# Patient Record
Sex: Male | Born: 1935 | Race: White | Hispanic: No | Marital: Married | Smoking: Former smoker
Health system: Southern US, Community
[De-identification: ages and names within clinical notes are randomized; demographics above are authoritative.]

## PROBLEM LIST (undated history)

## (undated) DIAGNOSIS — H9190 Unspecified hearing loss, unspecified ear: Secondary | ICD-10-CM

## (undated) DIAGNOSIS — C61 Malignant neoplasm of prostate: Secondary | ICD-10-CM

## (undated) HISTORY — DX: Unspecified hearing loss, unspecified ear: H91.90

## (undated) HISTORY — DX: Malignant neoplasm of prostate: C61

## (undated) HISTORY — PX: PACEMAKER PLACEMENT: SHX43

---

## 2003-10-18 ENCOUNTER — Other Ambulatory Visit: Payer: Self-pay

## 2004-07-04 ENCOUNTER — Other Ambulatory Visit: Payer: Self-pay

## 2004-07-04 ENCOUNTER — Ambulatory Visit: Payer: Self-pay | Admitting: Specialist

## 2004-07-09 ENCOUNTER — Ambulatory Visit: Payer: Self-pay | Admitting: Specialist

## 2005-04-02 ENCOUNTER — Ambulatory Visit: Payer: Self-pay | Admitting: General Surgery

## 2005-12-18 ENCOUNTER — Ambulatory Visit: Payer: Self-pay

## 2005-12-21 ENCOUNTER — Ambulatory Visit: Payer: Self-pay | Admitting: General Surgery

## 2005-12-21 ENCOUNTER — Inpatient Hospital Stay: Payer: Self-pay | Admitting: General Surgery

## 2005-12-30 ENCOUNTER — Ambulatory Visit: Payer: Self-pay | Admitting: General Surgery

## 2006-01-06 ENCOUNTER — Ambulatory Visit: Payer: Self-pay | Admitting: General Surgery

## 2006-01-12 ENCOUNTER — Ambulatory Visit: Payer: Self-pay | Admitting: General Surgery

## 2006-01-14 ENCOUNTER — Other Ambulatory Visit: Payer: Self-pay

## 2006-01-14 ENCOUNTER — Ambulatory Visit: Payer: Self-pay | Admitting: General Surgery

## 2007-04-27 ENCOUNTER — Ambulatory Visit: Payer: Self-pay | Admitting: Family Medicine

## 2007-04-29 ENCOUNTER — Ambulatory Visit: Payer: Self-pay | Admitting: Specialist

## 2007-04-29 ENCOUNTER — Other Ambulatory Visit: Payer: Self-pay

## 2007-05-05 ENCOUNTER — Ambulatory Visit: Payer: Self-pay | Admitting: Specialist

## 2007-07-04 ENCOUNTER — Ambulatory Visit: Payer: Self-pay | Admitting: Oncology

## 2007-07-25 ENCOUNTER — Ambulatory Visit: Payer: Self-pay | Admitting: Oncology

## 2007-07-29 ENCOUNTER — Ambulatory Visit: Payer: Self-pay | Admitting: Oncology

## 2007-08-04 ENCOUNTER — Ambulatory Visit: Payer: Self-pay | Admitting: Oncology

## 2007-09-04 ENCOUNTER — Ambulatory Visit: Payer: Self-pay | Admitting: Oncology

## 2007-09-14 ENCOUNTER — Ambulatory Visit: Payer: Self-pay | Admitting: Oncology

## 2007-09-23 ENCOUNTER — Ambulatory Visit: Payer: Self-pay | Admitting: Oncology

## 2007-10-02 ENCOUNTER — Ambulatory Visit: Payer: Self-pay | Admitting: Oncology

## 2007-12-02 ENCOUNTER — Ambulatory Visit: Payer: Self-pay | Admitting: Oncology

## 2007-12-07 ENCOUNTER — Ambulatory Visit: Payer: Self-pay | Admitting: Oncology

## 2007-12-19 ENCOUNTER — Ambulatory Visit: Payer: Self-pay | Admitting: Gastroenterology

## 2008-01-02 ENCOUNTER — Ambulatory Visit: Payer: Self-pay | Admitting: Oncology

## 2008-03-07 ENCOUNTER — Ambulatory Visit: Payer: Self-pay | Admitting: Oncology

## 2008-03-12 ENCOUNTER — Ambulatory Visit: Payer: Self-pay | Admitting: Oncology

## 2008-03-14 ENCOUNTER — Ambulatory Visit: Payer: Self-pay | Admitting: Oncology

## 2008-03-23 ENCOUNTER — Ambulatory Visit: Payer: Self-pay | Admitting: Oncology

## 2008-04-03 ENCOUNTER — Ambulatory Visit: Payer: Self-pay | Admitting: Oncology

## 2008-05-03 ENCOUNTER — Ambulatory Visit: Payer: Self-pay | Admitting: Oncology

## 2008-06-03 ENCOUNTER — Ambulatory Visit: Payer: Self-pay | Admitting: Oncology

## 2008-07-03 ENCOUNTER — Ambulatory Visit: Payer: Self-pay | Admitting: Oncology

## 2008-08-03 ENCOUNTER — Ambulatory Visit: Payer: Self-pay | Admitting: Oncology

## 2008-09-03 ENCOUNTER — Ambulatory Visit: Payer: Self-pay | Admitting: Oncology

## 2008-10-01 ENCOUNTER — Ambulatory Visit: Payer: Self-pay | Admitting: Oncology

## 2008-11-01 ENCOUNTER — Ambulatory Visit: Payer: Self-pay | Admitting: Oncology

## 2008-12-01 ENCOUNTER — Ambulatory Visit: Payer: Self-pay | Admitting: Oncology

## 2009-01-01 ENCOUNTER — Ambulatory Visit: Payer: Self-pay | Admitting: Oncology

## 2009-01-31 ENCOUNTER — Ambulatory Visit: Payer: Self-pay | Admitting: Oncology

## 2009-03-03 ENCOUNTER — Ambulatory Visit: Payer: Self-pay | Admitting: Oncology

## 2009-04-03 ENCOUNTER — Ambulatory Visit: Payer: Self-pay | Admitting: Oncology

## 2009-05-02 ENCOUNTER — Ambulatory Visit: Payer: Self-pay | Admitting: Gastroenterology

## 2009-05-03 ENCOUNTER — Ambulatory Visit: Payer: Self-pay | Admitting: Oncology

## 2009-06-03 ENCOUNTER — Ambulatory Visit: Payer: Self-pay | Admitting: Oncology

## 2009-07-03 ENCOUNTER — Ambulatory Visit: Payer: Self-pay | Admitting: Oncology

## 2009-08-03 ENCOUNTER — Ambulatory Visit: Payer: Self-pay | Admitting: Oncology

## 2009-09-03 ENCOUNTER — Ambulatory Visit: Payer: Self-pay | Admitting: Oncology

## 2009-10-01 ENCOUNTER — Ambulatory Visit: Payer: Self-pay | Admitting: Oncology

## 2009-12-01 ENCOUNTER — Ambulatory Visit: Payer: Self-pay | Admitting: Oncology

## 2009-12-20 ENCOUNTER — Ambulatory Visit: Payer: Self-pay | Admitting: Oncology

## 2010-01-01 ENCOUNTER — Ambulatory Visit: Payer: Self-pay | Admitting: Oncology

## 2010-03-03 ENCOUNTER — Ambulatory Visit: Payer: Self-pay | Admitting: Oncology

## 2010-03-24 ENCOUNTER — Ambulatory Visit: Payer: Self-pay | Admitting: Oncology

## 2010-04-03 ENCOUNTER — Ambulatory Visit: Payer: Self-pay | Admitting: Oncology

## 2010-04-10 ENCOUNTER — Ambulatory Visit: Payer: Self-pay | Admitting: Cardiology

## 2010-04-15 ENCOUNTER — Ambulatory Visit: Payer: Self-pay | Admitting: Cardiology

## 2010-05-03 ENCOUNTER — Ambulatory Visit: Payer: Self-pay | Admitting: Oncology

## 2010-05-06 ENCOUNTER — Ambulatory Visit: Payer: Self-pay | Admitting: Oncology

## 2010-06-03 ENCOUNTER — Ambulatory Visit: Payer: Self-pay | Admitting: Oncology

## 2010-07-03 ENCOUNTER — Ambulatory Visit: Payer: Self-pay | Admitting: Oncology

## 2010-08-03 ENCOUNTER — Ambulatory Visit: Payer: Self-pay | Admitting: Oncology

## 2010-09-03 ENCOUNTER — Ambulatory Visit: Payer: Self-pay | Admitting: Oncology

## 2010-10-14 ENCOUNTER — Ambulatory Visit: Payer: Self-pay | Admitting: Oncology

## 2010-10-21 LAB — CEA: CEA: 2.4 ng/mL (ref 0.0–4.7)

## 2010-11-02 ENCOUNTER — Ambulatory Visit: Payer: Self-pay | Admitting: Oncology

## 2010-12-02 ENCOUNTER — Ambulatory Visit: Payer: Self-pay | Admitting: Oncology

## 2010-12-15 ENCOUNTER — Ambulatory Visit: Payer: Self-pay | Admitting: Gastroenterology

## 2011-01-02 ENCOUNTER — Ambulatory Visit: Payer: Self-pay | Admitting: Oncology

## 2011-01-19 ENCOUNTER — Ambulatory Visit: Payer: Self-pay | Admitting: Gastroenterology

## 2011-02-05 ENCOUNTER — Ambulatory Visit: Payer: Self-pay | Admitting: Oncology

## 2011-03-04 ENCOUNTER — Ambulatory Visit: Payer: Self-pay | Admitting: Oncology

## 2011-03-24 ENCOUNTER — Ambulatory Visit: Payer: Self-pay | Admitting: Gastroenterology

## 2011-04-04 ENCOUNTER — Ambulatory Visit: Payer: Self-pay | Admitting: Oncology

## 2011-04-24 LAB — PSA: PSA: <0.1 ng/mL (ref 0.0–4.0)

## 2011-05-04 ENCOUNTER — Ambulatory Visit: Payer: Self-pay | Admitting: Oncology

## 2011-06-09 ENCOUNTER — Ambulatory Visit: Payer: Self-pay | Admitting: Oncology

## 2011-07-04 ENCOUNTER — Ambulatory Visit: Payer: Self-pay | Admitting: Oncology

## 2011-08-04 ENCOUNTER — Ambulatory Visit: Payer: Self-pay | Admitting: Oncology

## 2011-08-05 LAB — CBC CANCER CENTER
Basophil %: 0.3 %
Eosinophil #: 0 x10 3/mm (ref 0.0–0.7)
HGB: 13.7 g/dL (ref 13.0–18.0)
Lymphocyte %: 15.8 %
MCH: 30.2 pg (ref 26.0–34.0)
MCHC: 34 g/dL (ref 32.0–36.0)
MCV: 89 fL (ref 80–100)
Monocyte %: 7 %
Neutrophil #: 5.3 x10 3/mm (ref 1.4–6.5)
RDW: 14.9 % — ABNORMAL HIGH (ref 11.5–14.5)

## 2011-08-05 LAB — COMPREHENSIVE METABOLIC PANEL
Albumin: 3.5 g/dL (ref 3.4–5.0)
BUN: 34 mg/dL — ABNORMAL HIGH (ref 7–18)
Bilirubin,Total: 0.7 mg/dL (ref 0.2–1.0)
Chloride: 102 mmol/L (ref 98–107)
Creatinine: 1.55 mg/dL — ABNORMAL HIGH (ref 0.60–1.30)
EGFR (African American): 55 — ABNORMAL LOW
Potassium: 3.2 mmol/L — ABNORMAL LOW (ref 3.5–5.1)
SGPT (ALT): 24 U/L
Total Protein: 6.7 g/dL (ref 6.4–8.2)

## 2011-09-04 ENCOUNTER — Ambulatory Visit: Payer: Self-pay | Admitting: Oncology

## 2011-10-05 ENCOUNTER — Ambulatory Visit: Payer: Self-pay | Admitting: Oncology

## 2011-10-05 LAB — CBC CANCER CENTER
Basophil #: 0 x10 3/mm (ref 0.0–0.1)
Basophil %: 0.4 %
Eosinophil #: 0.1 x10 3/mm (ref 0.0–0.7)
HCT: 41.2 % (ref 40.0–52.0)
HGB: 14 g/dL (ref 13.0–18.0)
Lymphocyte %: 25.4 %
MCH: 30.9 pg (ref 26.0–34.0)
MCHC: 33.9 g/dL (ref 32.0–36.0)
Monocyte #: 0.4 x10 3/mm (ref 0.0–0.7)
Neutrophil #: 4.1 x10 3/mm (ref 1.4–6.5)
Neutrophil %: 66.6 %
Platelet: 171 x10 3/mm (ref 150–440)

## 2011-10-05 LAB — COMPREHENSIVE METABOLIC PANEL
Albumin: 3.6 g/dL (ref 3.4–5.0)
Calcium, Total: 9.4 mg/dL (ref 8.5–10.1)
Co2: 30 mmol/L (ref 21–32)
EGFR (African American): 53 — ABNORMAL LOW
Glucose: 109 mg/dL — ABNORMAL HIGH (ref 65–99)
Potassium: 4.2 mmol/L (ref 3.5–5.1)
SGOT(AST): 26 U/L (ref 15–37)
Total Protein: 6.7 g/dL (ref 6.4–8.2)

## 2011-10-06 LAB — PSA: PSA: <0.1 ng/mL (ref 0.0–4.0)

## 2011-11-02 ENCOUNTER — Ambulatory Visit: Payer: Self-pay | Admitting: Oncology

## 2011-12-24 ENCOUNTER — Ambulatory Visit: Payer: Self-pay | Admitting: Oncology

## 2011-12-24 LAB — COMPREHENSIVE METABOLIC PANEL
Alkaline Phosphatase: 60 U/L (ref 50–136)
BUN: 36 mg/dL — ABNORMAL HIGH (ref 7–18)
Bilirubin,Total: 0.5 mg/dL (ref 0.2–1.0)
Calcium, Total: 8.5 mg/dL (ref 8.5–10.1)
Creatinine: 1.85 mg/dL — ABNORMAL HIGH (ref 0.60–1.30)
EGFR (African American): 38 — ABNORMAL LOW
EGFR (Non-African Amer.): 32 — ABNORMAL LOW
Potassium: 3.4 mmol/L — ABNORMAL LOW (ref 3.5–5.1)
SGPT (ALT): 31 U/L
Sodium: 142 mmol/L (ref 136–145)

## 2011-12-24 LAB — CBC CANCER CENTER
Basophil %: 0.8 %
HGB: 13.1 g/dL (ref 13.0–18.0)
Lymphocyte #: 1 x10 3/mm (ref 1.0–3.6)
MCH: 29.3 pg (ref 26.0–34.0)
MCHC: 32.8 g/dL (ref 32.0–36.0)
Monocyte #: 0.5 x10 3/mm (ref 0.2–1.0)
Monocyte %: 6.5 %
Platelet: 179 x10 3/mm (ref 150–440)
RBC: 4.46 10*6/uL (ref 4.40–5.90)
RDW: 14 % (ref 11.5–14.5)

## 2012-01-02 ENCOUNTER — Ambulatory Visit: Payer: Self-pay | Admitting: Oncology

## 2012-02-08 ENCOUNTER — Ambulatory Visit: Payer: Self-pay | Admitting: Oncology

## 2012-02-08 LAB — CBC CANCER CENTER
Eosinophil #: 0.1 x10 3/mm (ref 0.0–0.7)
Eosinophil %: 1.1 %
HCT: 40.9 % (ref 40.0–52.0)
Lymphocyte #: 1.3 x10 3/mm (ref 1.0–3.6)
MCH: 29.9 pg (ref 26.0–34.0)
MCHC: 33.7 g/dL (ref 32.0–36.0)
MCV: 89 fL (ref 80–100)
Monocyte #: 0.5 x10 3/mm (ref 0.2–1.0)
Monocyte %: 7.1 %
Neutrophil #: 5.7 x10 3/mm (ref 1.4–6.5)
Platelet: 169 x10 3/mm (ref 150–440)
RDW: 14.5 % (ref 11.5–14.5)
WBC: 7.6 x10 3/mm (ref 3.8–10.6)

## 2012-02-08 LAB — COMPREHENSIVE METABOLIC PANEL
Albumin: 3.5 g/dL (ref 3.4–5.0)
Alkaline Phosphatase: 75 U/L (ref 50–136)
BUN: 41 mg/dL — ABNORMAL HIGH (ref 7–18)
Bilirubin,Total: 0.8 mg/dL (ref 0.2–1.0)
Calcium, Total: 9.4 mg/dL (ref 8.5–10.1)
Creatinine: 2 mg/dL — ABNORMAL HIGH (ref 0.60–1.30)
Glucose: 89 mg/dL (ref 65–99)
Osmolality: 293 (ref 275–301)
Sodium: 142 mmol/L (ref 136–145)

## 2012-02-22 LAB — BASIC METABOLIC PANEL
Anion Gap: 9 (ref 7–16)
BUN: 33 mg/dL — ABNORMAL HIGH (ref 7–18)
Chloride: 105 mmol/L (ref 98–107)
Co2: 31 mmol/L (ref 21–32)
EGFR (Non-African Amer.): 36 — ABNORMAL LOW
Osmolality: 295 (ref 275–301)

## 2012-03-03 ENCOUNTER — Ambulatory Visit: Payer: Self-pay | Admitting: Oncology

## 2012-04-19 ENCOUNTER — Ambulatory Visit: Payer: Self-pay | Admitting: Oncology

## 2012-04-19 LAB — COMPREHENSIVE METABOLIC PANEL
Albumin: 3.4 g/dL (ref 3.4–5.0)
Anion Gap: 5 — ABNORMAL LOW (ref 7–16)
BUN: 25 mg/dL — ABNORMAL HIGH (ref 7–18)
Bilirubin,Total: 0.8 mg/dL (ref 0.2–1.0)
Calcium, Total: 9 mg/dL (ref 8.5–10.1)
Creatinine: 1.79 mg/dL — ABNORMAL HIGH (ref 0.60–1.30)
Glucose: 185 mg/dL — ABNORMAL HIGH (ref 65–99)
Osmolality: 294 (ref 275–301)
Potassium: 2.9 mmol/L — ABNORMAL LOW (ref 3.5–5.1)
SGPT (ALT): 25 U/L (ref 12–78)
Sodium: 143 mmol/L (ref 136–145)
Total Protein: 6.6 g/dL (ref 6.4–8.2)

## 2012-04-19 LAB — CBC CANCER CENTER
Basophil #: 0.1 x10 3/mm (ref 0.0–0.1)
Eosinophil %: 0.6 %
HCT: 41.2 % (ref 40.0–52.0)
Lymphocyte #: 1 x10 3/mm (ref 1.0–3.6)
Lymphocyte %: 12.3 %
MCH: 28.9 pg (ref 26.0–34.0)
MCV: 89 fL (ref 80–100)
Monocyte %: 7.9 %
Neutrophil #: 6.3 x10 3/mm (ref 1.4–6.5)
Neutrophil %: 78.5 %
Platelet: 166 x10 3/mm (ref 150–440)
RBC: 4.62 10*6/uL (ref 4.40–5.90)
RDW: 14 % (ref 11.5–14.5)
WBC: 8 x10 3/mm (ref 3.8–10.6)

## 2012-04-23 ENCOUNTER — Emergency Department: Payer: Self-pay | Admitting: Emergency Medicine

## 2012-05-03 ENCOUNTER — Ambulatory Visit: Payer: Self-pay | Admitting: Oncology

## 2012-06-13 ENCOUNTER — Ambulatory Visit: Payer: Self-pay | Admitting: Oncology

## 2012-06-21 ENCOUNTER — Ambulatory Visit: Payer: Self-pay | Admitting: Oncology

## 2012-06-21 LAB — CBC CANCER CENTER
Basophil #: 0.1 x10 3/mm (ref 0.0–0.1)
HCT: 43.8 % (ref 40.0–52.0)
Lymphocyte #: 1.2 x10 3/mm (ref 1.0–3.6)
Lymphocyte %: 15.6 %
MCH: 28.5 pg (ref 26.0–34.0)
MCV: 89 fL (ref 80–100)
Monocyte %: 6.9 %
Neutrophil %: 75.6 %
Platelet: 156 x10 3/mm (ref 150–440)
RBC: 4.92 10*6/uL (ref 4.40–5.90)
RDW: 15.1 % — ABNORMAL HIGH (ref 11.5–14.5)
WBC: 7.7 x10 3/mm (ref 3.8–10.6)

## 2012-06-21 LAB — COMPREHENSIVE METABOLIC PANEL
Albumin: 3.4 g/dL (ref 3.4–5.0)
Alkaline Phosphatase: 56 U/L (ref 50–136)
Anion Gap: 8 (ref 7–16)
Co2: 32 mmol/L (ref 21–32)
Creatinine: 1.7 mg/dL — ABNORMAL HIGH (ref 0.60–1.30)
EGFR (African American): 41 — ABNORMAL LOW
Glucose: 112 mg/dL — ABNORMAL HIGH (ref 65–99)
Osmolality: 295 (ref 275–301)
Sodium: 144 mmol/L (ref 136–145)
Total Protein: 6.4 g/dL (ref 6.4–8.2)

## 2012-07-03 ENCOUNTER — Ambulatory Visit: Payer: Self-pay | Admitting: Oncology

## 2012-07-18 LAB — CBC CANCER CENTER
Basophil #: 0.1 x10 3/mm (ref 0.0–0.1)
Basophil %: 0.8 %
Eosinophil %: 1.4 %
HGB: 14.5 g/dL (ref 13.0–18.0)
Lymphocyte %: 21.3 %
MCH: 29.6 pg (ref 26.0–34.0)
Monocyte #: 0.5 x10 3/mm (ref 0.2–1.0)
Neutrophil %: 68.5 %
WBC: 6.8 x10 3/mm (ref 3.8–10.6)

## 2012-07-18 LAB — COMPREHENSIVE METABOLIC PANEL
Albumin: 3.4 g/dL (ref 3.4–5.0)
Anion Gap: 9 (ref 7–16)
Bilirubin,Total: 0.9 mg/dL (ref 0.2–1.0)
Calcium, Total: 8.9 mg/dL (ref 8.5–10.1)
Chloride: 105 mmol/L (ref 98–107)
Co2: 31 mmol/L (ref 21–32)
Creatinine: 1.69 mg/dL — ABNORMAL HIGH (ref 0.60–1.30)
EGFR (African American): 42 — ABNORMAL LOW
Potassium: 3.4 mmol/L — ABNORMAL LOW (ref 3.5–5.1)
SGPT (ALT): 30 U/L (ref 12–78)
Sodium: 145 mmol/L (ref 136–145)
Total Protein: 6.5 g/dL (ref 6.4–8.2)

## 2012-08-03 ENCOUNTER — Ambulatory Visit: Payer: Self-pay | Admitting: Oncology

## 2012-08-03 DIAGNOSIS — C61 Malignant neoplasm of prostate: Secondary | ICD-10-CM

## 2012-08-03 HISTORY — DX: Malignant neoplasm of prostate: C61

## 2012-10-01 ENCOUNTER — Ambulatory Visit: Payer: Self-pay | Admitting: Oncology

## 2012-10-10 LAB — CBC CANCER CENTER
Basophil %: 0.6 %
Eosinophil %: 1.3 %
HCT: 45.7 % (ref 40.0–52.0)
Lymphocyte #: 1.1 x10 3/mm (ref 1.0–3.6)
Lymphocyte %: 12.4 %
MCH: 29.3 pg (ref 26.0–34.0)
MCV: 88 fL (ref 80–100)
Monocyte #: 0.5 x10 3/mm (ref 0.2–1.0)
Neutrophil %: 80.4 %
RDW: 14.6 % — ABNORMAL HIGH (ref 11.5–14.5)
WBC: 8.7 x10 3/mm (ref 3.8–10.6)

## 2012-10-10 LAB — COMPREHENSIVE METABOLIC PANEL
Albumin: 3.5 g/dL (ref 3.4–5.0)
Alkaline Phosphatase: 70 U/L (ref 50–136)
Anion Gap: 8 (ref 7–16)
BUN: 42 mg/dL — ABNORMAL HIGH (ref 7–18)
Calcium, Total: 9.1 mg/dL (ref 8.5–10.1)
Co2: 33 mmol/L — ABNORMAL HIGH (ref 21–32)
Creatinine: 2.1 mg/dL — ABNORMAL HIGH (ref 0.60–1.30)
EGFR (Non-African Amer.): 28 — ABNORMAL LOW
Glucose: 146 mg/dL — ABNORMAL HIGH (ref 65–99)
Osmolality: 296 (ref 275–301)
SGOT(AST): 22 U/L (ref 15–37)
SGPT (ALT): 31 U/L (ref 12–78)
Total Protein: 6.7 g/dL (ref 6.4–8.2)

## 2012-11-01 ENCOUNTER — Ambulatory Visit: Payer: Self-pay | Admitting: Oncology

## 2012-11-21 LAB — COMPREHENSIVE METABOLIC PANEL
Albumin: 3.4 g/dL (ref 3.4–5.0)
Alkaline Phosphatase: 69 U/L (ref 50–136)
Anion Gap: 12 (ref 7–16)
Bilirubin,Total: 0.8 mg/dL (ref 0.2–1.0)
Chloride: 104 mmol/L (ref 98–107)
Creatinine: 2 mg/dL — ABNORMAL HIGH (ref 0.60–1.30)
EGFR (African American): 34 — ABNORMAL LOW
Glucose: 110 mg/dL — ABNORMAL HIGH (ref 65–99)
Osmolality: 297 (ref 275–301)
Potassium: 3.7 mmol/L (ref 3.5–5.1)
SGPT (ALT): 31 U/L (ref 12–78)
Sodium: 144 mmol/L (ref 136–145)
Total Protein: 6.7 g/dL (ref 6.4–8.2)

## 2012-11-21 LAB — CBC CANCER CENTER
Basophil %: 0.5 %
Eosinophil %: 0.4 %
HCT: 43.8 % (ref 40.0–52.0)
HGB: 14.8 g/dL (ref 13.0–18.0)
Lymphocyte #: 1 x10 3/mm (ref 1.0–3.6)
Lymphocyte %: 10 %
MCH: 29.3 pg (ref 26.0–34.0)
Monocyte #: 0.6 x10 3/mm (ref 0.2–1.0)
Monocyte %: 6 %
Platelet: 213 x10 3/mm (ref 150–440)
RDW: 14.5 % (ref 11.5–14.5)

## 2012-12-01 ENCOUNTER — Ambulatory Visit: Payer: Self-pay | Admitting: Oncology

## 2012-12-30 ENCOUNTER — Ambulatory Visit: Payer: Self-pay | Admitting: Neurology

## 2013-01-02 ENCOUNTER — Ambulatory Visit: Payer: Self-pay | Admitting: Oncology

## 2013-01-02 LAB — CBC CANCER CENTER
Basophil %: 0.9 %
Eosinophil #: 0.1 x10 3/mm (ref 0.0–0.7)
Eosinophil %: 1 %
HCT: 39.5 % — ABNORMAL LOW (ref 40.0–52.0)
HGB: 13.3 g/dL (ref 13.0–18.0)
MCH: 29.5 pg (ref 26.0–34.0)
MCHC: 33.8 g/dL (ref 32.0–36.0)
MCV: 87 fL (ref 80–100)
Monocyte %: 6.5 %
Neutrophil #: 4.8 x10 3/mm (ref 1.4–6.5)
Neutrophil %: 77.2 %
Platelet: 171 x10 3/mm (ref 150–440)
RBC: 4.53 10*6/uL (ref 4.40–5.90)
WBC: 6.2 x10 3/mm (ref 3.8–10.6)

## 2013-01-02 LAB — COMPREHENSIVE METABOLIC PANEL
Albumin: 3 g/dL — ABNORMAL LOW (ref 3.4–5.0)
Alkaline Phosphatase: 92 U/L (ref 50–136)
Anion Gap: 8 (ref 7–16)
BUN: 27 mg/dL — ABNORMAL HIGH (ref 7–18)
Co2: 32 mmol/L (ref 21–32)
EGFR (African American): 36 — ABNORMAL LOW
EGFR (Non-African Amer.): 31 — ABNORMAL LOW
Glucose: 108 mg/dL — ABNORMAL HIGH (ref 65–99)
Osmolality: 289 (ref 275–301)
Total Protein: 6.2 g/dL — ABNORMAL LOW (ref 6.4–8.2)

## 2013-01-02 LAB — HEPATIC FUNCTION PANEL A (ARMC): Bilirubin, Direct: 0.3 mg/dL — ABNORMAL HIGH (ref 0.00–0.20)

## 2013-01-09 ENCOUNTER — Inpatient Hospital Stay: Payer: Self-pay | Admitting: Specialist

## 2013-01-09 LAB — COMPREHENSIVE METABOLIC PANEL
Alkaline Phosphatase: 66 U/L (ref 50–136)
Calcium, Total: 8.6 mg/dL (ref 8.5–10.1)
Chloride: 100 mmol/L (ref 98–107)
Co2: 35 mmol/L — ABNORMAL HIGH (ref 21–32)
Creatinine: 2.59 mg/dL — ABNORMAL HIGH (ref 0.60–1.30)
EGFR (African American): 25 — ABNORMAL LOW
EGFR (Non-African Amer.): 21 — ABNORMAL LOW
Glucose: 100 mg/dL — ABNORMAL HIGH (ref 65–99)
Osmolality: 299 (ref 275–301)
SGOT(AST): 27 U/L (ref 15–37)

## 2013-01-09 LAB — TSH: Thyroid Stimulating Horm: 1.23 u[IU]/mL

## 2013-01-09 LAB — URINALYSIS, COMPLETE
Blood: NEGATIVE
Glucose,UR: NEGATIVE mg/dL (ref 0–75)
Leukocyte Esterase: NEGATIVE
Nitrite: NEGATIVE
Ph: 7 (ref 4.5–8.0)
Protein: NEGATIVE
Specific Gravity: 1.006 (ref 1.003–1.030)
WBC UR: NONE SEEN /HPF (ref 0–5)

## 2013-01-09 LAB — CBC
HCT: 38.7 % — ABNORMAL LOW (ref 40.0–52.0)
HGB: 12.9 g/dL — ABNORMAL LOW (ref 13.0–18.0)
MCHC: 33.4 g/dL (ref 32.0–36.0)
MCV: 87 fL (ref 80–100)
RBC: 4.44 10*6/uL (ref 4.40–5.90)
RDW: 14.3 % (ref 11.5–14.5)

## 2013-01-09 LAB — CK TOTAL AND CKMB (NOT AT ARMC)
CK, Total: 84 U/L (ref 35–232)
CK-MB: 1.6 ng/mL (ref 0.5–3.6)

## 2013-01-09 LAB — TROPONIN I
Troponin-I: 0.06 ng/mL — ABNORMAL HIGH
Troponin-I: 0.04 ng/mL

## 2013-01-09 LAB — POTASSIUM: Potassium: 3.2 mmol/L — ABNORMAL LOW (ref 3.5–5.1)

## 2013-01-09 LAB — MAGNESIUM: Magnesium: 2.1 mg/dL

## 2013-01-10 LAB — CBC WITH DIFFERENTIAL/PLATELET
Basophil #: 0.1 10*3/uL (ref 0.0–0.1)
Eosinophil #: 0.1 10*3/uL (ref 0.0–0.7)
HGB: 12.5 g/dL — ABNORMAL LOW (ref 13.0–18.0)
Lymphocyte %: 15.5 %
MCH: 29.2 pg (ref 26.0–34.0)
MCHC: 33.7 g/dL (ref 32.0–36.0)
Monocyte %: 6.7 %
Neutrophil #: 3.9 10*3/uL (ref 1.4–6.5)
Neutrophil %: 75.2 %
Platelet: 163 10*3/uL (ref 150–440)
RDW: 14.5 % (ref 11.5–14.5)
WBC: 5.2 10*3/uL (ref 3.8–10.6)

## 2013-01-10 LAB — BASIC METABOLIC PANEL
Anion Gap: 8 (ref 7–16)
Calcium, Total: 8.7 mg/dL (ref 8.5–10.1)
Chloride: 103 mmol/L (ref 98–107)
Creatinine: 2.29 mg/dL — ABNORMAL HIGH (ref 0.60–1.30)
EGFR (African American): 29 — ABNORMAL LOW
Glucose: 118 mg/dL — ABNORMAL HIGH (ref 65–99)
Osmolality: 304 (ref 275–301)
Potassium: 3.8 mmol/L (ref 3.5–5.1)

## 2013-01-10 LAB — MAGNESIUM: Magnesium: 2.1 mg/dL

## 2013-01-10 LAB — TROPONIN I: Troponin-I: 0.04 ng/mL

## 2013-01-31 ENCOUNTER — Ambulatory Visit: Payer: Self-pay | Admitting: Oncology

## 2013-02-15 LAB — COMPREHENSIVE METABOLIC PANEL
Alkaline Phosphatase: 107 U/L (ref 50–136)
Anion Gap: 7 (ref 7–16)
BUN: 43 mg/dL — ABNORMAL HIGH (ref 7–18)
Chloride: 104 mmol/L (ref 98–107)
Co2: 37 mmol/L — ABNORMAL HIGH (ref 21–32)
EGFR (African American): 32 — ABNORMAL LOW
EGFR (Non-African Amer.): 28 — ABNORMAL LOW
Glucose: 114 mg/dL — ABNORMAL HIGH (ref 65–99)
Osmolality: 306 (ref 275–301)
Potassium: 3.1 mmol/L — ABNORMAL LOW (ref 3.5–5.1)
SGPT (ALT): 28 U/L (ref 12–78)
Sodium: 148 mmol/L — ABNORMAL HIGH (ref 136–145)

## 2013-02-15 LAB — CBC CANCER CENTER
Basophil #: 0 x10 3/mm (ref 0.0–0.1)
Eosinophil #: 0 x10 3/mm (ref 0.0–0.7)
Eosinophil %: 0.5 %
HCT: 40.3 % (ref 40.0–52.0)
MCHC: 34.1 g/dL (ref 32.0–36.0)
Neutrophil #: 6.8 x10 3/mm — ABNORMAL HIGH (ref 1.4–6.5)
Neutrophil %: 83.8 %
Platelet: 167 x10 3/mm (ref 150–440)
RBC: 4.61 10*6/uL (ref 4.40–5.90)
RDW: 15.3 % — ABNORMAL HIGH (ref 11.5–14.5)
WBC: 8.1 x10 3/mm (ref 3.8–10.6)

## 2013-03-03 ENCOUNTER — Ambulatory Visit: Payer: Self-pay | Admitting: Oncology

## 2013-03-14 ENCOUNTER — Encounter: Payer: Self-pay | Admitting: General Surgery

## 2013-03-14 ENCOUNTER — Ambulatory Visit (INDEPENDENT_AMBULATORY_CARE_PROVIDER_SITE_OTHER): Payer: Medicare Other | Admitting: General Surgery

## 2013-03-14 VITALS — BP 118/70 | HR 82 | Resp 16 | Ht 76.0 in | Wt 194.0 lb

## 2013-03-14 DIAGNOSIS — R1032 Left lower quadrant pain: Secondary | ICD-10-CM | POA: Insufficient documentation

## 2013-03-14 DIAGNOSIS — K5900 Constipation, unspecified: Secondary | ICD-10-CM | POA: Insufficient documentation

## 2013-03-14 MED ORDER — CIPROFLOXACIN HCL 500 MG PO TABS: 500.0000 mg | ORAL_TABLET | Freq: Two times a day (BID) | ORAL | Status: AC

## 2013-03-14 MED ORDER — METRONIDAZOLE 500 MG PO TABS: 500.0000 mg | ORAL_TABLET | Freq: Three times a day (TID) | ORAL | Status: AC

## 2013-03-14 NOTE — Patient Instructions (Addendum)
Patient to have CT scan of abdomin/pelvis with contrast. Follow up to be announced once CT Scan has been done.   This patient is to have labs drawn at [facility].  Patient has been scheduled for a CT abdomen/pelvis with contrast at [facility] for 03-15-13 at 10 am (arrive 9:45 am). Prep: no solids 4 hours prior but patient may have clear liquids up until exam time, pick up prep kit, and take medication list. Patient verbalizes understanding.

## 2013-03-14 NOTE — Progress Notes (Signed)
Patient ID: Dillon Davis, male   DOB: Sep 19, 1923, 79 y.o.   MRN: [id number]  Chief Complaint  Patient presents with  . Other    hernia evaluation    HPI Dillon Davis is a 79 y.o. male.  Patient here today for an evaluation of a possiblkehernia.  States that he noticed it last Friday when he felt some pain the right groin area..  It does seem to be causing some abdominal pain.  No nausea, vomiting, or diarrhea noted. He has had some constipation in last few weeks, using Miralax at present. The hernia is located in the right groin. Patient states he has been unsteady on his feet for approximately 1 month or longer.   HPI  Past Medical History  Diagnosis Date  . Prostate cancer 2014  . Hearing loss     Past Surgical History  Procedure Laterality Date  . Pacemaker placement      History reviewed. No pertinent family history.  Social History History  Substance Use Topics  . Smoking status: Former Smoker    Quit date: 08/03/1946  . Smokeless tobacco: Not on file  . Alcohol Use: No    Allergies  Allergen Reactions  . Codeine Itching    tingling  . Sulfa Antibiotics Other (See Comments)    burning    Current Outpatient Prescriptions  Medication Sig Dispense Refill  . gabapentin (NEURONTIN) 300 MG capsule 300 mg 2 (two) times daily.       Marland Kitchen ipratropium (ATROVENT) 0.03 % nasal spray       . KLOR-CON M20 20 MEQ tablet Take 20 mEq by mouth daily.       . metoprolol succinate (TOPROL-XL) 25 MG 24 hr tablet Take 25 mg by mouth daily.       . prednisoLONE 5 MG TABS tablet Take 5 mg by mouth 2 (two) times daily.      Marland Kitchen torsemide (DEMADEX) 20 MG tablet Take 20 mg by mouth daily.       Marland Kitchen ZYTIGA 250 MG tablet 750 mg daily.       . ciprofloxacin (CIPRO) 500 MG tablet Take 1 tablet (500 mg total) by mouth 2 (two) times daily.  14 tablet  0  . metroNIDAZOLE (FLAGYL) 500 MG tablet Take 1 tablet (500 mg total) by mouth 3 (three) times daily.  21 tablet  0   No current facility-administered  medications for this visit.    Review of Systems Review of Systems  Constitutional: Negative.   Respiratory: Negative.   Cardiovascular: Negative.   Gastrointestinal: Positive for abdominal pain and constipation.    Blood pressure 118/70, pulse 82, resp. rate 16, height 6\' 4"  (1.93 m), weight 194 lb (87.998 kg).  Physical Exam Physical Exam  Constitutional: He is oriented to person, place, and time. He appears well-developed and well-nourished.  Eyes: Conjunctivae are normal. No scleral icterus.  Neck: Trachea normal. No thyromegaly present.  Cardiovascular: Normal rate, regular rhythm and normal heart sounds.   No murmur heard. Pulmonary/Chest: Effort normal and breath sounds normal.  Abdominal: Soft. Normal appearance and bowel sounds are normal. There is tenderness in the left lower quadrant. There is no rebound and no guarding. No hernia.  Genitourinary: Right testis shows swelling (tense 5cm fluctuant swelling , sonsistent with a small hydrocele).  Neurological: He is alert and oriented to person, place, and time.  Skin: Skin is warm and dry.    Data Reviewed  None  Assessment    Patient appears to have  pain in the right groin and lower quadrant with no findings there. But he does have tenderness in the LLQ. The pain is preceded by constipation suggesting possible diverticulitis.     Plan    Empiric treatment with Cipro and Flagyl.  Patient to be scheduled for CT scan of abdomin/pelivis with contrast.     This patient is to have the following labs drawn today at [facility]: CBC and Met C.  Patient has been scheduled for a CT abdomen/pelvis with contrast at [facility] for 03-15-13 at 10 am (arrive 9:45 am). Prep: no solids 4 hours prior but patient may have clear liquids up until exam time, pick up prep kit, and take medication list. Patient verbalizes understanding. Lab results will be forwarded to [facility] CT Department once  available.   SANKAR,SEEPLAPUTHUR G 03/14/2013, 7:36 PM

## 2013-03-15 ENCOUNTER — Ambulatory Visit: Payer: Self-pay | Admitting: General Surgery

## 2013-03-15 ENCOUNTER — Encounter: Payer: Self-pay | Admitting: General Surgery

## 2013-03-15 LAB — CBC WITH DIFFERENTIAL/PLATELET
Eosinophils Absolute: 0.1 10*3/uL (ref 0.0–0.4)
Immature Grans (Abs): 0 10*3/uL (ref 0.0–0.1)
Immature Granulocytes: 0 % (ref 0–2)
MCH: 28.7 pg (ref 26.6–33.0)
MCHC: 33.2 g/dL (ref 31.5–35.7)
MCV: 87 fL (ref 79–97)
Monocytes Absolute: 0.5 10*3/uL (ref 0.1–0.9)
Neutrophils Relative %: 80 % — ABNORMAL HIGH (ref 40–74)
RDW: 15.3 % (ref 12.3–15.4)

## 2013-03-15 LAB — COMPREHENSIVE METABOLIC PANEL
AST: 22 IU/L (ref 0–40)
Albumin: 4.3 g/dL (ref 3.5–4.7)
Alkaline Phosphatase: 78 IU/L (ref 39–117)
BUN/Creatinine Ratio: 18 (ref 10–22)
BUN: 36 mg/dL — ABNORMAL HIGH (ref 8–27)
Chloride: 102 mmol/L (ref 97–108)
GFR calc Af Amer: 33 mL/min/{1.73_m2} — ABNORMAL LOW (ref >59)
Globulin, Total: 1.4 g/dL — ABNORMAL LOW (ref 1.5–4.5)
Sodium: 147 mmol/L — ABNORMAL HIGH (ref 134–144)
Total Bilirubin: 0.7 mg/dL (ref 0.0–1.2)

## 2013-03-16 LAB — CBC CANCER CENTER
Basophil #: 0.1 x10 3/mm (ref 0.0–0.1)
Basophil %: 1.1 %
Eosinophil %: 1 %
HCT: 41.1 % (ref 40.0–52.0)
Lymphocyte #: 0.9 x10 3/mm — ABNORMAL LOW (ref 1.0–3.6)
Lymphocyte %: 15.2 %
MCH: 29.3 pg (ref 26.0–34.0)
MCHC: 33.7 g/dL (ref 32.0–36.0)
Platelet: 157 x10 3/mm (ref 150–440)
RBC: 4.73 10*6/uL (ref 4.40–5.90)
WBC: 6.1 x10 3/mm (ref 3.8–10.6)

## 2013-03-16 LAB — COMPREHENSIVE METABOLIC PANEL
Anion Gap: 8 (ref 7–16)
BUN: 43 mg/dL — ABNORMAL HIGH (ref 7–18)
Calcium, Total: 8.8 mg/dL (ref 8.5–10.1)
Chloride: 102 mmol/L (ref 98–107)
EGFR (African American): 27 — ABNORMAL LOW
EGFR (Non-African Amer.): 24 — ABNORMAL LOW
Glucose: 191 mg/dL — ABNORMAL HIGH (ref 65–99)
Osmolality: 303 (ref 275–301)
SGOT(AST): 17 U/L (ref 15–37)
SGPT (ALT): 24 U/L (ref 12–78)
Total Protein: 5.9 g/dL — ABNORMAL LOW (ref 6.4–8.2)

## 2013-03-22 ENCOUNTER — Ambulatory Visit (INDEPENDENT_AMBULATORY_CARE_PROVIDER_SITE_OTHER): Payer: Medicare Other | Admitting: General Surgery

## 2013-03-22 ENCOUNTER — Encounter: Payer: Self-pay | Admitting: General Surgery

## 2013-03-22 VITALS — BP 120/64 | HR 70 | Resp 12 | Ht 75.0 in | Wt 193.0 lb

## 2013-03-22 DIAGNOSIS — R1032 Left lower quadrant pain: Secondary | ICD-10-CM

## 2013-03-22 NOTE — Patient Instructions (Addendum)
Patient to return as needed. 

## 2013-03-22 NOTE — Progress Notes (Signed)
Patient is here today for an follow up ct done on 03/15/13. Pt is doing better now, his pain in lower abd and groin resolved. CT shows no hernia, no evidence of inflammtory problem. A large retroperitoneal lymph node is noted-unchanged form prior eval-this is metastatic prostate CA by prior biopsy. Dr. Doylene Canning following. Pt advised of above.

## 2013-04-03 ENCOUNTER — Ambulatory Visit: Payer: Self-pay | Admitting: Oncology

## 2013-04-13 LAB — CBC CANCER CENTER
Basophil #: 0.1 x10 3/mm (ref 0.0–0.1)
Basophil %: 1.1 %
Eosinophil #: 0.1 x10 3/mm (ref 0.0–0.7)
Eosinophil %: 1.6 %
HCT: 40.7 % (ref 40.0–52.0)
HGB: 13.4 g/dL (ref 13.0–18.0)
Lymphocyte #: 0.9 x10 3/mm — ABNORMAL LOW (ref 1.0–3.6)
Lymphocyte %: 18.3 %
MCH: 28.7 pg (ref 26.0–34.0)
MCHC: 33 g/dL (ref 32.0–36.0)
MCV: 87 fL (ref 80–100)
Monocyte #: 0.4 x10 3/mm (ref 0.2–1.0)
Monocyte %: 8.5 %
Neutrophil %: 70.5 %
RBC: 4.68 10*6/uL (ref 4.40–5.90)
WBC: 5.1 x10 3/mm (ref 3.8–10.6)

## 2013-04-13 LAB — COMPREHENSIVE METABOLIC PANEL
Alkaline Phosphatase: 100 U/L (ref 50–136)
Anion Gap: 10 (ref 7–16)
BUN: 37 mg/dL — ABNORMAL HIGH (ref 7–18)
Bilirubin,Total: 1 mg/dL (ref 0.2–1.0)
Calcium, Total: 9.1 mg/dL (ref 8.5–10.1)
Creatinine: 2.09 mg/dL — ABNORMAL HIGH (ref 0.60–1.30)
EGFR (African American): 32 — ABNORMAL LOW
Sodium: 145 mmol/L (ref 136–145)
Total Protein: 6.2 g/dL — ABNORMAL LOW (ref 6.4–8.2)

## 2013-05-03 ENCOUNTER — Ambulatory Visit: Payer: Self-pay | Admitting: Oncology

## 2013-06-13 ENCOUNTER — Encounter: Payer: Self-pay | Admitting: Cardiothoracic Surgery

## 2013-06-14 ENCOUNTER — Ambulatory Visit: Payer: Self-pay | Admitting: Oncology

## 2013-06-14 LAB — COMPREHENSIVE METABOLIC PANEL
Albumin: 3.4 g/dL (ref 3.4–5.0)
BUN: 52 mg/dL — ABNORMAL HIGH (ref 7–18)
Bilirubin,Total: 1.1 mg/dL — ABNORMAL HIGH (ref 0.2–1.0)
Calcium, Total: 9.6 mg/dL (ref 8.5–10.1)
Chloride: 101 mmol/L (ref 98–107)
Co2: 36 mmol/L — ABNORMAL HIGH (ref 21–32)
EGFR (African American): 28 — ABNORMAL LOW
EGFR (Non-African Amer.): 24 — ABNORMAL LOW
Glucose: 116 mg/dL — ABNORMAL HIGH (ref 65–99)
SGPT (ALT): 18 U/L (ref 12–78)

## 2013-06-14 LAB — CBC CANCER CENTER
Basophil %: 0.4 %
HCT: 41.8 % (ref 40.0–52.0)
Lymphocyte #: 0.6 x10 3/mm — ABNORMAL LOW (ref 1.0–3.6)
Lymphocyte %: 7.8 %
MCHC: 32.1 g/dL (ref 32.0–36.0)
MCV: 87 fL (ref 80–100)
Monocyte #: 0.5 x10 3/mm (ref 0.2–1.0)
Monocyte %: 6.1 %
Neutrophil #: 7 x10 3/mm — ABNORMAL HIGH (ref 1.4–6.5)
Neutrophil %: 85.4 %
Platelet: 181 x10 3/mm (ref 150–440)
RBC: 4.82 10*6/uL (ref 4.40–5.90)
RDW: 16.3 % — ABNORMAL HIGH (ref 11.5–14.5)
WBC: 8.1 x10 3/mm (ref 3.8–10.6)

## 2013-06-15 LAB — PSA: PSA: <0.1 ng/mL (ref 0.0–4.0)

## 2013-07-03 ENCOUNTER — Ambulatory Visit: Payer: Self-pay | Admitting: Oncology

## 2013-07-21 ENCOUNTER — Encounter: Payer: Self-pay | Admitting: Surgery

## 2013-08-03 ENCOUNTER — Encounter: Payer: Self-pay | Admitting: Surgery

## 2013-08-16 ENCOUNTER — Ambulatory Visit: Payer: Self-pay | Admitting: Oncology

## 2013-08-16 LAB — CBC CANCER CENTER
Basophil #: 0.1 x10 3/mm (ref 0.0–0.1)
Basophil %: 1 %
EOS ABS: 0.1 x10 3/mm (ref 0.0–0.7)
Eosinophil %: 1.5 %
HCT: 39.9 % — ABNORMAL LOW (ref 40.0–52.0)
HGB: 12.8 g/dL — ABNORMAL LOW (ref 13.0–18.0)
LYMPHS PCT: 14.5 %
Lymphocyte #: 1 x10 3/mm (ref 1.0–3.6)
MCH: 28 pg (ref 26.0–34.0)
MCHC: 32.1 g/dL (ref 32.0–36.0)
MCV: 87 fL (ref 80–100)
MONO ABS: 0.6 x10 3/mm (ref 0.2–1.0)
MONOS PCT: 7.9 %
NEUTROS PCT: 75.1 %
Neutrophil #: 5.3 x10 3/mm (ref 1.4–6.5)
PLATELETS: 164 x10 3/mm (ref 150–440)
RBC: 4.59 10*6/uL (ref 4.40–5.90)
RDW: 15.5 % — ABNORMAL HIGH (ref 11.5–14.5)
WBC: 7 x10 3/mm (ref 3.8–10.6)

## 2013-08-16 LAB — COMPREHENSIVE METABOLIC PANEL
Albumin: 3.4 g/dL (ref 3.4–5.0)
Alkaline Phosphatase: 95 U/L
Anion Gap: 2 — ABNORMAL LOW (ref 7–16)
BUN: 38 mg/dL — AB (ref 7–18)
Bilirubin,Total: 0.9 mg/dL (ref 0.2–1.0)
CO2: 35 mmol/L — AB (ref 21–32)
Calcium, Total: 9 mg/dL (ref 8.5–10.1)
Chloride: 102 mmol/L (ref 98–107)
Creatinine: 1.96 mg/dL — ABNORMAL HIGH (ref 0.60–1.30)
EGFR (African American): 35 — ABNORMAL LOW
GFR CALC NON AF AMER: 30 — AB
Glucose: 133 mg/dL — ABNORMAL HIGH (ref 65–99)
Osmolality: 289 (ref 275–301)
Potassium: 3 mmol/L — ABNORMAL LOW (ref 3.5–5.1)
SGOT(AST): 21 U/L (ref 15–37)
SGPT (ALT): 24 U/L (ref 12–78)
SODIUM: 139 mmol/L (ref 136–145)
Total Protein: 6.5 g/dL (ref 6.4–8.2)

## 2013-09-03 ENCOUNTER — Ambulatory Visit: Payer: Self-pay | Admitting: Oncology

## 2013-09-03 ENCOUNTER — Encounter: Payer: Self-pay | Admitting: Surgery

## 2013-10-01 ENCOUNTER — Encounter: Payer: Self-pay | Admitting: Surgery

## 2013-11-22 ENCOUNTER — Ambulatory Visit: Payer: Self-pay | Admitting: Oncology

## 2013-11-22 LAB — CBC CANCER CENTER
Basophil #: 0.1 x10 3/mm (ref 0.0–0.1)
Basophil %: 1.1 %
Eosinophil #: 0.1 x10 3/mm (ref 0.0–0.7)
Eosinophil %: 1.1 %
HCT: 39.2 % — AB (ref 40.0–52.0)
HGB: 12.5 g/dL — ABNORMAL LOW (ref 13.0–18.0)
Lymphocyte #: 0.9 x10 3/mm — ABNORMAL LOW (ref 1.0–3.6)
Lymphocyte %: 15 %
MCH: 27.4 pg (ref 26.0–34.0)
MCHC: 31.9 g/dL — AB (ref 32.0–36.0)
MCV: 86 fL (ref 80–100)
MONOS PCT: 9.1 %
Monocyte #: 0.5 x10 3/mm (ref 0.2–1.0)
NEUTROS PCT: 73.7 %
Neutrophil #: 4.3 x10 3/mm (ref 1.4–6.5)
Platelet: 174 x10 3/mm (ref 150–440)
RBC: 4.57 10*6/uL (ref 4.40–5.90)
RDW: 16.4 % — ABNORMAL HIGH (ref 11.5–14.5)
WBC: 5.9 x10 3/mm (ref 3.8–10.6)

## 2013-11-22 LAB — COMPREHENSIVE METABOLIC PANEL
ANION GAP: 8 (ref 7–16)
AST: 18 U/L (ref 15–37)
Albumin: 3.6 g/dL (ref 3.4–5.0)
Alkaline Phosphatase: 118 U/L — ABNORMAL HIGH
BUN: 31 mg/dL — ABNORMAL HIGH (ref 7–18)
Bilirubin,Total: 0.9 mg/dL (ref 0.2–1.0)
CALCIUM: 9.1 mg/dL (ref 8.5–10.1)
Chloride: 105 mmol/L (ref 98–107)
Co2: 34 mmol/L — ABNORMAL HIGH (ref 21–32)
Creatinine: 1.94 mg/dL — ABNORMAL HIGH (ref 0.60–1.30)
EGFR (African American): 35 — ABNORMAL LOW
EGFR (Non-African Amer.): 30 — ABNORMAL LOW
GLUCOSE: 146 mg/dL — AB (ref 65–99)
OSMOLALITY: 302 (ref 275–301)
Potassium: 3.4 mmol/L — ABNORMAL LOW (ref 3.5–5.1)
SGPT (ALT): 19 U/L (ref 12–78)
Sodium: 147 mmol/L — ABNORMAL HIGH (ref 136–145)
Total Protein: 6.7 g/dL (ref 6.4–8.2)

## 2013-11-23 LAB — PSA: PSA: <0.1 ng/mL (ref 0.0–4.0)

## 2013-12-01 ENCOUNTER — Ambulatory Visit: Payer: Self-pay | Admitting: Oncology

## 2013-12-08 ENCOUNTER — Inpatient Hospital Stay: Payer: Self-pay | Admitting: Internal Medicine

## 2013-12-08 LAB — COMPREHENSIVE METABOLIC PANEL
ANION GAP: 8 (ref 7–16)
Albumin: 3.1 g/dL — ABNORMAL LOW (ref 3.4–5.0)
Alkaline Phosphatase: 108 U/L
BILIRUBIN TOTAL: 0.9 mg/dL (ref 0.2–1.0)
BUN: 40 mg/dL — ABNORMAL HIGH (ref 7–18)
CALCIUM: 8.7 mg/dL (ref 8.5–10.1)
Chloride: 104 mmol/L (ref 98–107)
Co2: 31 mmol/L (ref 21–32)
Creatinine: 2.37 mg/dL — ABNORMAL HIGH (ref 0.60–1.30)
EGFR (Non-African Amer.): 24 — ABNORMAL LOW
GFR CALC AF AMER: 28 — AB
Glucose: 146 mg/dL — ABNORMAL HIGH (ref 65–99)
Osmolality: 297 (ref 275–301)
Potassium: 3 mmol/L — ABNORMAL LOW (ref 3.5–5.1)
SGOT(AST): 22 U/L (ref 15–37)
SGPT (ALT): 12 U/L (ref 12–78)
SODIUM: 143 mmol/L (ref 136–145)
TOTAL PROTEIN: 5.9 g/dL — AB (ref 6.4–8.2)

## 2013-12-08 LAB — CK TOTAL AND CKMB (NOT AT ARMC)
CK, Total: 65 U/L
CK-MB: 1.5 ng/mL (ref 0.5–3.6)

## 2013-12-08 LAB — CBC
HCT: 34.6 % — AB (ref 40.0–52.0)
HGB: 11.2 g/dL — ABNORMAL LOW (ref 13.0–18.0)
MCH: 27.7 pg (ref 26.0–34.0)
MCHC: 32.3 g/dL (ref 32.0–36.0)
MCV: 86 fL (ref 80–100)
Platelet: 153 10*3/uL (ref 150–440)
RBC: 4.04 10*6/uL — ABNORMAL LOW (ref 4.40–5.90)
RDW: 16.2 % — AB (ref 11.5–14.5)
WBC: 6.3 10*3/uL (ref 3.8–10.6)

## 2013-12-08 LAB — PROTIME-INR
INR: 1.1
Prothrombin Time: 13.8 secs (ref 11.5–14.7)

## 2013-12-08 LAB — TROPONIN I: Troponin-I: <0.02 ng/mL

## 2013-12-08 LAB — HEMOGLOBIN A1C: Hemoglobin A1C: 6.4 % — ABNORMAL HIGH (ref 4.2–6.3)

## 2013-12-08 LAB — MAGNESIUM: Magnesium: 2.3 mg/dL

## 2013-12-09 LAB — COMPREHENSIVE METABOLIC PANEL
ANION GAP: 5 — AB (ref 7–16)
Albumin: 3 g/dL — ABNORMAL LOW (ref 3.4–5.0)
Alkaline Phosphatase: 107 U/L
BUN: 38 mg/dL — AB (ref 7–18)
Bilirubin,Total: 0.9 mg/dL (ref 0.2–1.0)
CO2: 30 mmol/L (ref 21–32)
Calcium, Total: 8.4 mg/dL — ABNORMAL LOW (ref 8.5–10.1)
Chloride: 106 mmol/L (ref 98–107)
Creatinine: 2.15 mg/dL — ABNORMAL HIGH (ref 0.60–1.30)
EGFR (Non-African Amer.): 27 — ABNORMAL LOW
GFR CALC AF AMER: 31 — AB
GLUCOSE: 125 mg/dL — AB (ref 65–99)
OSMOLALITY: 292 (ref 275–301)
Potassium: 3.7 mmol/L (ref 3.5–5.1)
SGOT(AST): 22 U/L (ref 15–37)
SGPT (ALT): 13 U/L (ref 12–78)
Sodium: 141 mmol/L (ref 136–145)
TOTAL PROTEIN: 5.2 g/dL — AB (ref 6.4–8.2)

## 2013-12-09 LAB — CBC WITH DIFFERENTIAL/PLATELET
BASOS ABS: 0.1 10*3/uL (ref 0.0–0.1)
BASOS PCT: 0.7 %
EOS ABS: 0.1 10*3/uL (ref 0.0–0.7)
Eosinophil %: 1.1 %
HCT: 35.3 % — ABNORMAL LOW (ref 40.0–52.0)
HGB: 11 g/dL — AB (ref 13.0–18.0)
LYMPHS ABS: 0.6 10*3/uL — AB (ref 1.0–3.6)
Lymphocyte %: 7.9 %
MCH: 26.9 pg (ref 26.0–34.0)
MCHC: 31.2 g/dL — ABNORMAL LOW (ref 32.0–36.0)
MCV: 86 fL (ref 80–100)
MONOS PCT: 7 %
Monocyte #: 0.5 x10 3/mm (ref 0.2–1.0)
Neutrophil #: 5.9 10*3/uL (ref 1.4–6.5)
Neutrophil %: 83.3 %
PLATELETS: 145 10*3/uL — AB (ref 150–440)
RBC: 4.09 10*6/uL — ABNORMAL LOW (ref 4.40–5.90)
RDW: 16.1 % — AB (ref 11.5–14.5)
WBC: 7.1 10*3/uL (ref 3.8–10.6)

## 2013-12-09 LAB — OCCULT BLOOD X 1 CARD TO LAB, STOOL: OCCULT BLOOD, FECES: POSITIVE

## 2013-12-09 LAB — PROTIME-INR
INR: 1.1
Prothrombin Time: 14.3 secs (ref 11.5–14.7)

## 2013-12-10 LAB — BASIC METABOLIC PANEL
ANION GAP: 1 — AB (ref 7–16)
BUN: 34 mg/dL — ABNORMAL HIGH (ref 7–18)
CO2: 29 mmol/L (ref 21–32)
CREATININE: 1.85 mg/dL — AB (ref 0.60–1.30)
Calcium, Total: 8.2 mg/dL — ABNORMAL LOW (ref 8.5–10.1)
Chloride: 110 mmol/L — ABNORMAL HIGH (ref 98–107)
EGFR (Non-African Amer.): 32 — ABNORMAL LOW
GFR CALC AF AMER: 37 — AB
Glucose: 118 mg/dL — ABNORMAL HIGH (ref 65–99)
Osmolality: 288 (ref 275–301)
POTASSIUM: 3.7 mmol/L (ref 3.5–5.1)
Sodium: 140 mmol/L (ref 136–145)

## 2013-12-10 LAB — PROTIME-INR
INR: 1.1
Prothrombin Time: 13.8 secs (ref 11.5–14.7)

## 2013-12-11 LAB — BASIC METABOLIC PANEL
Anion Gap: 6 — ABNORMAL LOW (ref 7–16)
BUN: 28 mg/dL — ABNORMAL HIGH (ref 7–18)
CALCIUM: 8.3 mg/dL — AB (ref 8.5–10.1)
CO2: 28 mmol/L (ref 21–32)
Chloride: 108 mmol/L — ABNORMAL HIGH (ref 98–107)
Creatinine: 1.86 mg/dL — ABNORMAL HIGH (ref 0.60–1.30)
EGFR (African American): 37 — ABNORMAL LOW
EGFR (Non-African Amer.): 32 — ABNORMAL LOW
Glucose: 121 mg/dL — ABNORMAL HIGH (ref 65–99)
Osmolality: 290 (ref 275–301)
Potassium: 3.5 mmol/L (ref 3.5–5.1)
Sodium: 142 mmol/L (ref 136–145)

## 2013-12-11 LAB — PROTIME-INR
INR: 1.1
Prothrombin Time: 14.1 secs (ref 11.5–14.7)

## 2013-12-11 LAB — VANCOMYCIN, TROUGH: Vancomycin, Trough: 17 ug/mL (ref 10–20)

## 2013-12-12 LAB — WOUND AEROBIC CULTURE

## 2013-12-12 LAB — PROTIME-INR
INR: 1.2
Prothrombin Time: 15.3 secs — ABNORMAL HIGH (ref 11.5–14.7)

## 2013-12-13 LAB — CULTURE, BLOOD (SINGLE)

## 2013-12-18 ENCOUNTER — Encounter: Payer: Self-pay | Admitting: Surgery

## 2013-12-26 ENCOUNTER — Emergency Department: Payer: Self-pay | Admitting: Emergency Medicine

## 2013-12-28 ENCOUNTER — Ambulatory Visit: Payer: Self-pay | Admitting: Surgery

## 2014-01-01 ENCOUNTER — Encounter: Payer: Self-pay | Admitting: Surgery

## 2014-01-01 ENCOUNTER — Ambulatory Visit: Payer: Self-pay | Admitting: Oncology

## 2014-01-31 ENCOUNTER — Encounter: Payer: Self-pay | Admitting: Surgery

## 2014-02-08 ENCOUNTER — Encounter: Payer: Self-pay | Admitting: General Surgery

## 2014-02-10 ENCOUNTER — Emergency Department: Payer: Self-pay | Admitting: Internal Medicine

## 2014-02-10 LAB — PROTIME-INR
INR: 1.2
Prothrombin Time: 15.1 secs — ABNORMAL HIGH (ref 11.5–14.7)

## 2014-02-10 LAB — COMPREHENSIVE METABOLIC PANEL
Albumin: 3 g/dL — ABNORMAL LOW (ref 3.4–5.0)
Alkaline Phosphatase: 106 U/L
Anion Gap: 11 (ref 7–16)
BUN: 27 mg/dL — ABNORMAL HIGH (ref 7–18)
Bilirubin,Total: 1.8 mg/dL — ABNORMAL HIGH (ref 0.2–1.0)
CREATININE: 1.58 mg/dL — AB (ref 0.60–1.30)
Calcium, Total: 8.5 mg/dL (ref 8.5–10.1)
Chloride: 105 mmol/L (ref 98–107)
Co2: 27 mmol/L (ref 21–32)
GFR CALC AF AMER: 45 — AB
GFR CALC NON AF AMER: 39 — AB
Glucose: 111 mg/dL — ABNORMAL HIGH (ref 65–99)
OSMOLALITY: 291 (ref 275–301)
POTASSIUM: 3.3 mmol/L — AB (ref 3.5–5.1)
SGOT(AST): 22 U/L (ref 15–37)
SGPT (ALT): 14 U/L (ref 12–78)
Sodium: 143 mmol/L (ref 136–145)
TOTAL PROTEIN: 6.3 g/dL — AB (ref 6.4–8.2)

## 2014-02-10 LAB — CBC
HCT: 36.6 % — ABNORMAL LOW (ref 40.0–52.0)
HGB: 12 g/dL — ABNORMAL LOW (ref 13.0–18.0)
MCH: 27.5 pg (ref 26.0–34.0)
MCHC: 32.9 g/dL (ref 32.0–36.0)
MCV: 84 fL (ref 80–100)
Platelet: 175 10*3/uL (ref 150–440)
RBC: 4.38 10*6/uL — AB (ref 4.40–5.90)
RDW: 16.4 % — ABNORMAL HIGH (ref 11.5–14.5)
WBC: 8.6 10*3/uL (ref 3.8–10.6)

## 2014-02-10 LAB — URINALYSIS, COMPLETE
BACTERIA: NONE SEEN
Bilirubin,UR: NEGATIVE
Glucose,UR: NEGATIVE mg/dL (ref 0–75)
Leukocyte Esterase: NEGATIVE
Nitrite: NEGATIVE
PH: 5 (ref 4.5–8.0)
Protein: NEGATIVE
SPECIFIC GRAVITY: 1.006 (ref 1.003–1.030)
Squamous Epithelial: 1

## 2014-02-10 LAB — PRO B NATRIURETIC PEPTIDE: B-TYPE NATIURETIC PEPTID: 3688 pg/mL — AB (ref 0–450)

## 2014-02-10 LAB — TROPONIN I: Troponin-I: <0.02 ng/mL

## 2014-02-28 ENCOUNTER — Ambulatory Visit: Payer: Self-pay | Admitting: Oncology

## 2014-02-28 LAB — CBC CANCER CENTER
Basophil #: 0.1 x10 3/mm (ref 0.0–0.1)
Basophil %: 1 %
EOS ABS: 0.1 x10 3/mm (ref 0.0–0.7)
Eosinophil %: 1.7 %
HCT: 36.3 % — ABNORMAL LOW (ref 40.0–52.0)
HGB: 11.6 g/dL — ABNORMAL LOW (ref 13.0–18.0)
Lymphocyte #: 0.8 x10 3/mm — ABNORMAL LOW (ref 1.0–3.6)
Lymphocyte %: 9.4 %
MCH: 26.4 pg (ref 26.0–34.0)
MCHC: 31.8 g/dL — ABNORMAL LOW (ref 32.0–36.0)
MCV: 83 fL (ref 80–100)
MONOS PCT: 7.5 %
Monocyte #: 0.7 x10 3/mm (ref 0.2–1.0)
NEUTROS ABS: 7 x10 3/mm — AB (ref 1.4–6.5)
Neutrophil %: 80.4 %
PLATELETS: 245 x10 3/mm (ref 150–440)
RBC: 4.37 10*6/uL — ABNORMAL LOW (ref 4.40–5.90)
RDW: 16.9 % — ABNORMAL HIGH (ref 11.5–14.5)
WBC: 8.7 x10 3/mm (ref 3.8–10.6)

## 2014-02-28 LAB — COMPREHENSIVE METABOLIC PANEL
ALBUMIN: 3.1 g/dL — AB (ref 3.4–5.0)
ALK PHOS: 125 U/L — AB
Anion Gap: 6 — ABNORMAL LOW (ref 7–16)
BILIRUBIN TOTAL: 0.8 mg/dL (ref 0.2–1.0)
BUN: 27 mg/dL — ABNORMAL HIGH (ref 7–18)
CHLORIDE: 113 mmol/L — AB (ref 98–107)
Calcium, Total: 8.9 mg/dL (ref 8.5–10.1)
Co2: 32 mmol/L (ref 21–32)
Creatinine: 1.83 mg/dL — ABNORMAL HIGH (ref 0.60–1.30)
EGFR (African American): 38 — ABNORMAL LOW
EGFR (Non-African Amer.): 32 — ABNORMAL LOW
Glucose: 123 mg/dL — ABNORMAL HIGH (ref 65–99)
Osmolality: 306 (ref 275–301)
Potassium: 3.9 mmol/L (ref 3.5–5.1)
SGOT(AST): 15 U/L (ref 15–37)
SGPT (ALT): 16 U/L
Sodium: 151 mmol/L — ABNORMAL HIGH (ref 136–145)
TOTAL PROTEIN: 6.1 g/dL — AB (ref 6.4–8.2)

## 2014-03-02 LAB — PSA: PSA: <0.1 ng/mL (ref 0.0–4.0)

## 2014-03-03 ENCOUNTER — Encounter: Payer: Self-pay | Admitting: Surgery

## 2014-03-03 ENCOUNTER — Encounter: Payer: Self-pay | Admitting: General Surgery

## 2014-03-03 ENCOUNTER — Ambulatory Visit: Payer: Self-pay | Admitting: Oncology

## 2014-04-03 ENCOUNTER — Encounter: Payer: Self-pay | Admitting: Surgery

## 2014-05-03 ENCOUNTER — Encounter: Payer: Self-pay | Admitting: Surgery

## 2014-05-03 ENCOUNTER — Ambulatory Visit: Payer: Self-pay | Admitting: Oncology

## 2014-05-06 LAB — BASIC METABOLIC PANEL
Anion Gap: 7 (ref 7–16)
BUN: 35 mg/dL — AB (ref 7–18)
CALCIUM: 8.4 mg/dL — AB (ref 8.5–10.1)
CO2: 28 mmol/L (ref 21–32)
CREATININE: 1.84 mg/dL — AB (ref 0.60–1.30)
Chloride: 111 mmol/L — ABNORMAL HIGH (ref 98–107)
EGFR (African American): 45 — ABNORMAL LOW
GFR CALC NON AF AMER: 37 — AB
GLUCOSE: 106 mg/dL — AB (ref 65–99)
Osmolality: 299 (ref 275–301)
Potassium: 3.2 mmol/L — ABNORMAL LOW (ref 3.5–5.1)
Sodium: 146 mmol/L — ABNORMAL HIGH (ref 136–145)

## 2014-05-06 LAB — CBC
HCT: 40.7 % (ref 40.0–52.0)
HGB: 12.3 g/dL — ABNORMAL LOW (ref 13.0–18.0)
MCH: 25.2 pg — ABNORMAL LOW (ref 26.0–34.0)
MCHC: 30.1 g/dL — AB (ref 32.0–36.0)
MCV: 84 fL (ref 80–100)
Platelet: 265 10*3/uL (ref 150–440)
RBC: 4.86 10*6/uL (ref 4.40–5.90)
RDW: 17.5 % — ABNORMAL HIGH (ref 11.5–14.5)
WBC: 8.7 10*3/uL (ref 3.8–10.6)

## 2014-05-07 ENCOUNTER — Inpatient Hospital Stay: Payer: Self-pay | Admitting: Internal Medicine

## 2014-05-07 LAB — APTT
Activated PTT: 31.3 secs (ref 23.6–35.9)
Activated PTT: 131.7 secs — ABNORMAL HIGH (ref 23.6–35.9)

## 2014-05-07 LAB — PROTIME-INR
INR: 1
Prothrombin Time: 13.3 secs (ref 11.5–14.7)

## 2014-05-08 LAB — CBC WITH DIFFERENTIAL/PLATELET
BASOS ABS: 0 10*3/uL (ref 0.0–0.1)
Basophil %: 0.8 %
Eosinophil #: 0.1 10*3/uL (ref 0.0–0.7)
Eosinophil %: 1.4 %
HCT: 35.5 % — ABNORMAL LOW (ref 40.0–52.0)
HGB: 11.2 g/dL — AB (ref 13.0–18.0)
Lymphocyte #: 0.7 10*3/uL — ABNORMAL LOW (ref 1.0–3.6)
Lymphocyte %: 11.6 %
MCH: 26 pg (ref 26.0–34.0)
MCHC: 31.6 g/dL — ABNORMAL LOW (ref 32.0–36.0)
MCV: 82 fL (ref 80–100)
MONO ABS: 0.5 x10 3/mm (ref 0.2–1.0)
MONOS PCT: 7.7 %
NEUTROS PCT: 78.5 %
Neutrophil #: 4.9 10*3/uL (ref 1.4–6.5)
Platelet: 186 10*3/uL (ref 150–440)
RBC: 4.33 10*6/uL — ABNORMAL LOW (ref 4.40–5.90)
RDW: 16.8 % — ABNORMAL HIGH (ref 11.5–14.5)
WBC: 6.3 10*3/uL (ref 3.8–10.6)

## 2014-05-08 LAB — BASIC METABOLIC PANEL
Anion Gap: 6 — ABNORMAL LOW (ref 7–16)
BUN: 31 mg/dL — ABNORMAL HIGH (ref 7–18)
CREATININE: 1.47 mg/dL — AB (ref 0.60–1.30)
Calcium, Total: 8.2 mg/dL — ABNORMAL LOW (ref 8.5–10.1)
Chloride: 112 mmol/L — ABNORMAL HIGH (ref 98–107)
Co2: 26 mmol/L (ref 21–32)
EGFR (African American): 58 — ABNORMAL LOW
GFR CALC NON AF AMER: 48 — AB
Glucose: 87 mg/dL (ref 65–99)
Osmolality: 293 (ref 275–301)
POTASSIUM: 3.4 mmol/L — AB (ref 3.5–5.1)
SODIUM: 144 mmol/L (ref 136–145)

## 2014-05-14 LAB — COMPREHENSIVE METABOLIC PANEL
ALK PHOS: 119 U/L — AB
Albumin: 2.7 g/dL — ABNORMAL LOW (ref 3.4–5.0)
Anion Gap: 8 (ref 7–16)
BUN: 34 mg/dL — ABNORMAL HIGH (ref 7–18)
Bilirubin,Total: 1.7 mg/dL — ABNORMAL HIGH (ref 0.2–1.0)
CO2: 26 mmol/L (ref 21–32)
Calcium, Total: 8 mg/dL — ABNORMAL LOW (ref 8.5–10.1)
Chloride: 111 mmol/L — ABNORMAL HIGH (ref 98–107)
Creatinine: 1.63 mg/dL — ABNORMAL HIGH (ref 0.60–1.30)
EGFR (Non-African Amer.): 43 — ABNORMAL LOW
GFR CALC AF AMER: 52 — AB
Glucose: 86 mg/dL (ref 65–99)
OSMOLALITY: 296 (ref 275–301)
Potassium: 3.7 mmol/L (ref 3.5–5.1)
SGOT(AST): 21 U/L (ref 15–37)
SGPT (ALT): 10 U/L — ABNORMAL LOW
Sodium: 145 mmol/L (ref 136–145)
Total Protein: 6 g/dL — ABNORMAL LOW (ref 6.4–8.2)

## 2014-05-14 LAB — URINALYSIS, COMPLETE
BILIRUBIN, UR: NEGATIVE
GLUCOSE, UR: NEGATIVE mg/dL (ref 0–75)
LEUKOCYTE ESTERASE: NEGATIVE
NITRITE: NEGATIVE
PH: 5 (ref 4.5–8.0)
Protein: 100
RBC,UR: 22 /HPF (ref 0–5)
Specific Gravity: 1.015 (ref 1.003–1.030)
Squamous Epithelial: NONE SEEN

## 2014-05-14 LAB — CBC
HCT: 39.3 % — AB (ref 40.0–52.0)
HGB: 12.4 g/dL — AB (ref 13.0–18.0)
MCH: 26 pg (ref 26.0–34.0)
MCHC: 31.6 g/dL — ABNORMAL LOW (ref 32.0–36.0)
MCV: 82 fL (ref 80–100)
Platelet: 220 10*3/uL (ref 150–440)
RBC: 4.78 10*6/uL (ref 4.40–5.90)
RDW: 17 % — ABNORMAL HIGH (ref 11.5–14.5)
WBC: 8.5 10*3/uL (ref 3.8–10.6)

## 2014-05-14 LAB — CK TOTAL AND CKMB (NOT AT ARMC)
CK, Total: 68 U/L
CK-MB: 1.5 ng/mL (ref 0.5–3.6)

## 2014-05-14 LAB — TROPONIN I: Troponin-I: <0.02 ng/mL

## 2014-05-16 ENCOUNTER — Inpatient Hospital Stay: Payer: Self-pay | Admitting: Internal Medicine

## 2014-05-16 LAB — BASIC METABOLIC PANEL
ANION GAP: 8 (ref 7–16)
BUN: 27 mg/dL — ABNORMAL HIGH (ref 7–18)
Calcium, Total: 7.5 mg/dL — ABNORMAL LOW (ref 8.5–10.1)
Chloride: 110 mmol/L — ABNORMAL HIGH (ref 98–107)
Co2: 27 mmol/L (ref 21–32)
Creatinine: 1.51 mg/dL — ABNORMAL HIGH (ref 0.60–1.30)
EGFR (African American): 56 — ABNORMAL LOW
EGFR (Non-African Amer.): 47 — ABNORMAL LOW
GLUCOSE: 82 mg/dL (ref 65–99)
Osmolality: 293 (ref 275–301)
Potassium: 3.3 mmol/L — ABNORMAL LOW (ref 3.5–5.1)
Sodium: 145 mmol/L (ref 136–145)

## 2014-05-17 LAB — BASIC METABOLIC PANEL
Anion Gap: 8 (ref 7–16)
BUN: 26 mg/dL — ABNORMAL HIGH (ref 7–18)
CALCIUM: 7.8 mg/dL — AB (ref 8.5–10.1)
Chloride: 110 mmol/L — ABNORMAL HIGH (ref 98–107)
Co2: 26 mmol/L (ref 21–32)
Creatinine: 1.4 mg/dL — ABNORMAL HIGH (ref 0.60–1.30)
EGFR (Non-African Amer.): 51 — ABNORMAL LOW
Glucose: 91 mg/dL (ref 65–99)
OSMOLALITY: 291 (ref 275–301)
POTASSIUM: 3.4 mmol/L — AB (ref 3.5–5.1)
Sodium: 144 mmol/L (ref 136–145)

## 2014-05-18 LAB — HEMOGLOBIN: HGB: 11.4 g/dL — ABNORMAL LOW (ref 13.0–18.0)

## 2014-05-19 LAB — CULTURE, BLOOD (SINGLE)

## 2014-05-31 ENCOUNTER — Observation Stay: Payer: Self-pay | Admitting: Internal Medicine

## 2014-05-31 LAB — BASIC METABOLIC PANEL
Anion Gap: 14 (ref 7–16)
BUN: 30 mg/dL — AB (ref 7–18)
CO2: 24 mmol/L (ref 21–32)
Calcium, Total: 7.7 mg/dL — ABNORMAL LOW (ref 8.5–10.1)
Chloride: 107 mmol/L (ref 98–107)
Creatinine: 1.74 mg/dL — ABNORMAL HIGH (ref 0.60–1.30)
EGFR (African American): 48 — ABNORMAL LOW
GFR CALC NON AF AMER: 40 — AB
GLUCOSE: 77 mg/dL (ref 65–99)
Osmolality: 294 (ref 275–301)
Potassium: 3.2 mmol/L — ABNORMAL LOW (ref 3.5–5.1)
SODIUM: 145 mmol/L (ref 136–145)

## 2014-05-31 LAB — CBC
HCT: 37.7 % — AB (ref 40.0–52.0)
HGB: 12 g/dL — AB (ref 13.0–18.0)
MCH: 26.1 pg (ref 26.0–34.0)
MCHC: 31.8 g/dL — ABNORMAL LOW (ref 32.0–36.0)
MCV: 82 fL (ref 80–100)
Platelet: 284 10*3/uL (ref 150–440)
RBC: 4.59 10*6/uL (ref 4.40–5.90)
RDW: 17.3 % — AB (ref 11.5–14.5)
WBC: 7.7 10*3/uL (ref 3.8–10.6)

## 2014-05-31 LAB — URINALYSIS, COMPLETE
BACTERIA: NONE SEEN
BLOOD: NEGATIVE
Bilirubin,UR: NEGATIVE
Glucose,UR: NEGATIVE mg/dL (ref 0–75)
LEUKOCYTE ESTERASE: NEGATIVE
NITRITE: NEGATIVE
Ph: 5 (ref 4.5–8.0)
SQUAMOUS EPITHELIAL: NONE SEEN
Specific Gravity: 1.015 (ref 1.003–1.030)

## 2014-05-31 LAB — TROPONIN I: Troponin-I: 0.02 ng/mL

## 2014-06-01 DIAGNOSIS — I351 Nonrheumatic aortic (valve) insufficiency: Secondary | ICD-10-CM

## 2014-06-01 LAB — CBC WITH DIFFERENTIAL/PLATELET
BASOS ABS: 0.1 10*3/uL (ref 0.0–0.1)
BASOS PCT: 1.1 %
EOS ABS: 0 10*3/uL (ref 0.0–0.7)
EOS PCT: 0.4 %
HCT: 34.1 % — ABNORMAL LOW (ref 40.0–52.0)
HGB: 10.8 g/dL — ABNORMAL LOW (ref 13.0–18.0)
LYMPHS PCT: 4.9 %
Lymphocyte #: 0.3 10*3/uL — ABNORMAL LOW (ref 1.0–3.6)
MCH: 26 pg (ref 26.0–34.0)
MCHC: 31.8 g/dL — ABNORMAL LOW (ref 32.0–36.0)
MCV: 82 fL (ref 80–100)
Monocyte #: 0.3 x10 3/mm (ref 0.2–1.0)
Monocyte %: 4.3 %
NEUTROS ABS: 5.8 10*3/uL (ref 1.4–6.5)
Neutrophil %: 89.3 %
PLATELETS: 223 10*3/uL (ref 150–440)
RBC: 4.17 10*6/uL — ABNORMAL LOW (ref 4.40–5.90)
RDW: 16.9 % — AB (ref 11.5–14.5)
WBC: 6.5 10*3/uL (ref 3.8–10.6)

## 2014-06-01 LAB — LACTATE DEHYDROGENASE, PLEURAL OR PERITONEAL FLUID: LDH, BODY FLUID: 79 U/L

## 2014-06-01 LAB — BODY FLUID CELL COUNT WITH DIFFERENTIAL
Basophil: 0 %
Eosinophil: 0 %
Lymphocytes: 48 %
Neutrophils: 4 %
Nucleated Cell Count: 458 /mm3
Other Cells BF: 0 %
Other Mononuclear Cells: 48 %

## 2014-06-01 LAB — BASIC METABOLIC PANEL
Anion Gap: 12 (ref 7–16)
BUN: 28 mg/dL — AB (ref 7–18)
Calcium, Total: 7.6 mg/dL — ABNORMAL LOW (ref 8.5–10.1)
Chloride: 108 mmol/L — ABNORMAL HIGH (ref 98–107)
Co2: 23 mmol/L (ref 21–32)
Creatinine: 1.69 mg/dL — ABNORMAL HIGH (ref 0.60–1.30)
EGFR (African American): 50 — ABNORMAL LOW
EGFR (Non-African Amer.): 41 — ABNORMAL LOW
GLUCOSE: 108 mg/dL — AB (ref 65–99)
OSMOLALITY: 291 (ref 275–301)
POTASSIUM: 3.2 mmol/L — AB (ref 3.5–5.1)
Sodium: 143 mmol/L (ref 136–145)

## 2014-06-01 LAB — LACTATE DEHYDROGENASE: LDH: 182 U/L (ref 85–241)

## 2014-06-02 LAB — MAGNESIUM: MAGNESIUM: 1.8 mg/dL

## 2014-06-02 LAB — CBC WITH DIFFERENTIAL/PLATELET
BASOS ABS: 0.1 10*3/uL (ref 0.0–0.1)
BASOS PCT: 1.2 %
Eosinophil #: 0 10*3/uL (ref 0.0–0.7)
Eosinophil %: 0.7 %
HCT: 34 % — AB (ref 40.0–52.0)
HGB: 10.9 g/dL — ABNORMAL LOW (ref 13.0–18.0)
Lymphocyte #: 0.5 10*3/uL — ABNORMAL LOW (ref 1.0–3.6)
Lymphocyte %: 8 %
MCH: 26.3 pg (ref 26.0–34.0)
MCHC: 32.1 g/dL (ref 32.0–36.0)
MCV: 82 fL (ref 80–100)
Monocyte #: 0.4 x10 3/mm (ref 0.2–1.0)
Monocyte %: 6.8 %
Neutrophil #: 4.8 10*3/uL (ref 1.4–6.5)
Neutrophil %: 83.3 %
PLATELETS: 216 10*3/uL (ref 150–440)
RBC: 4.15 10*6/uL — ABNORMAL LOW (ref 4.40–5.90)
RDW: 16.8 % — ABNORMAL HIGH (ref 11.5–14.5)
WBC: 5.7 10*3/uL (ref 3.8–10.6)

## 2014-06-02 LAB — PROTIME-INR
INR: 1.1
PROTHROMBIN TIME: 14 s (ref 11.5–14.7)

## 2014-06-02 LAB — BASIC METABOLIC PANEL
ANION GAP: 11 (ref 7–16)
BUN: 28 mg/dL — ABNORMAL HIGH (ref 7–18)
CALCIUM: 7.6 mg/dL — AB (ref 8.5–10.1)
CREATININE: 1.77 mg/dL — AB (ref 0.60–1.30)
Chloride: 107 mmol/L (ref 98–107)
Co2: 24 mmol/L (ref 21–32)
EGFR (African American): 47 — ABNORMAL LOW
GFR CALC NON AF AMER: 39 — AB
Glucose: 111 mg/dL — ABNORMAL HIGH (ref 65–99)
OSMOLALITY: 289 (ref 275–301)
Potassium: 3.2 mmol/L — ABNORMAL LOW (ref 3.5–5.1)
Sodium: 142 mmol/L (ref 136–145)

## 2014-06-02 LAB — AMMONIA: AMMONIA, PLASMA: 23 umol/L (ref 11–32)

## 2014-06-02 LAB — LACTATE DEHYDROGENASE: LDH: 196 U/L (ref 85–241)

## 2014-06-03 ENCOUNTER — Ambulatory Visit: Payer: Self-pay | Admitting: Oncology

## 2014-06-05 ENCOUNTER — Observation Stay: Payer: Self-pay | Admitting: Internal Medicine

## 2014-06-05 LAB — COMPREHENSIVE METABOLIC PANEL
ALT: 9 U/L — AB
ANION GAP: 14 (ref 7–16)
AST: 17 U/L (ref 15–37)
Albumin: 2.4 g/dL — ABNORMAL LOW (ref 3.4–5.0)
Alkaline Phosphatase: 118 U/L — ABNORMAL HIGH
BUN: 36 mg/dL — ABNORMAL HIGH (ref 7–18)
Bilirubin,Total: 1.1 mg/dL — ABNORMAL HIGH (ref 0.2–1.0)
CHLORIDE: 105 mmol/L (ref 98–107)
Calcium, Total: 7.9 mg/dL — ABNORMAL LOW (ref 8.5–10.1)
Co2: 24 mmol/L (ref 21–32)
Creatinine: 2.19 mg/dL — ABNORMAL HIGH (ref 0.60–1.30)
EGFR (African American): 37 — ABNORMAL LOW
EGFR (Non-African Amer.): 30 — ABNORMAL LOW
GLUCOSE: 79 mg/dL (ref 65–99)
Osmolality: 292 (ref 275–301)
POTASSIUM: 3.2 mmol/L — AB (ref 3.5–5.1)
Sodium: 143 mmol/L (ref 136–145)
TOTAL PROTEIN: 5.8 g/dL — AB (ref 6.4–8.2)

## 2014-06-05 LAB — URINALYSIS, COMPLETE
BACTERIA: NONE SEEN
Bilirubin,UR: NEGATIVE
Blood: NEGATIVE
Glucose,UR: NEGATIVE mg/dL (ref 0–75)
Hyaline Cast: 8
KETONE: NEGATIVE
LEUKOCYTE ESTERASE: NEGATIVE
NITRITE: NEGATIVE
Ph: 5 (ref 4.5–8.0)
Protein: NEGATIVE
RBC,UR: 1 /HPF (ref 0–5)
Specific Gravity: 1.016 (ref 1.003–1.030)
Squamous Epithelial: 1
WBC UR: 1 /HPF (ref 0–5)

## 2014-06-05 LAB — CBC
HCT: 40.5 % (ref 40.0–52.0)
HGB: 12.9 g/dL — ABNORMAL LOW (ref 13.0–18.0)
MCH: 26 pg (ref 26.0–34.0)
MCHC: 31.8 g/dL — ABNORMAL LOW (ref 32.0–36.0)
MCV: 82 fL (ref 80–100)
Platelet: 255 10*3/uL (ref 150–440)
RBC: 4.96 10*6/uL (ref 4.40–5.90)
RDW: 17.7 % — ABNORMAL HIGH (ref 11.5–14.5)
WBC: 8.6 10*3/uL (ref 3.8–10.6)

## 2014-06-05 LAB — TROPONIN I: Troponin-I: <0.02 ng/mL

## 2014-06-05 LAB — CK TOTAL AND CKMB (NOT AT ARMC)
CK, Total: 36 U/L — ABNORMAL LOW
CK-MB: 1.5 ng/mL (ref 0.5–3.6)

## 2014-06-05 LAB — BODY FLUID CULTURE

## 2014-06-05 LAB — PROTIME-INR
INR: 1.1
PROTHROMBIN TIME: 14.2 s (ref 11.5–14.7)

## 2014-06-05 LAB — MAGNESIUM: Magnesium: 2.1 mg/dL

## 2014-06-06 LAB — CBC WITH DIFFERENTIAL/PLATELET
BASOS ABS: 0.1 10*3/uL (ref 0.0–0.1)
Basophil %: 0.7 %
EOS PCT: 0.2 %
Eosinophil #: 0 10*3/uL (ref 0.0–0.7)
HCT: 39.5 % — ABNORMAL LOW (ref 40.0–52.0)
HGB: 12.3 g/dL — AB (ref 13.0–18.0)
LYMPHS PCT: 5.3 %
Lymphocyte #: 0.5 10*3/uL — ABNORMAL LOW (ref 1.0–3.6)
MCH: 26.1 pg (ref 26.0–34.0)
MCHC: 31.2 g/dL — AB (ref 32.0–36.0)
MCV: 84 fL (ref 80–100)
Monocyte #: 0.4 x10 3/mm (ref 0.2–1.0)
Monocyte %: 4 %
Neutrophil #: 8.3 10*3/uL — ABNORMAL HIGH (ref 1.4–6.5)
Neutrophil %: 89.8 %
Platelet: 204 10*3/uL (ref 150–440)
RBC: 4.71 10*6/uL (ref 4.40–5.90)
RDW: 17.5 % — AB (ref 11.5–14.5)
WBC: 9.3 10*3/uL (ref 3.8–10.6)

## 2014-06-06 LAB — BASIC METABOLIC PANEL
Anion Gap: 17 — ABNORMAL HIGH (ref 7–16)
BUN: 40 mg/dL — ABNORMAL HIGH (ref 7–18)
CO2: 17 mmol/L — AB (ref 21–32)
Calcium, Total: 7.5 mg/dL — ABNORMAL LOW (ref 8.5–10.1)
Chloride: 109 mmol/L — ABNORMAL HIGH (ref 98–107)
Creatinine: 1.85 mg/dL — ABNORMAL HIGH (ref 0.60–1.30)
EGFR (African American): 45 — ABNORMAL LOW
GFR CALC NON AF AMER: 37 — AB
Glucose: 74 mg/dL (ref 65–99)
OSMOLALITY: 293 (ref 275–301)
Potassium: 4.2 mmol/L (ref 3.5–5.1)
SODIUM: 143 mmol/L (ref 136–145)

## 2014-06-08 LAB — BASIC METABOLIC PANEL
ANION GAP: 12 (ref 7–16)
BUN: 49 mg/dL — AB (ref 7–18)
CHLORIDE: 109 mmol/L — AB (ref 98–107)
CREATININE: 2.59 mg/dL — AB (ref 0.60–1.30)
Calcium, Total: 7.2 mg/dL — ABNORMAL LOW (ref 8.5–10.1)
Co2: 19 mmol/L — ABNORMAL LOW (ref 21–32)
EGFR (Non-African Amer.): 25 — ABNORMAL LOW
GFR CALC AF AMER: 30 — AB
GLUCOSE: 112 mg/dL — AB (ref 65–99)
OSMOLALITY: 293 (ref 275–301)
POTASSIUM: 4.3 mmol/L (ref 3.5–5.1)
Sodium: 140 mmol/L (ref 136–145)

## 2014-06-09 LAB — BASIC METABOLIC PANEL
Anion Gap: 11 (ref 7–16)
BUN: 49 mg/dL — ABNORMAL HIGH (ref 7–18)
CALCIUM: 7.3 mg/dL — AB (ref 8.5–10.1)
CHLORIDE: 110 mmol/L — AB (ref 98–107)
CREATININE: 2.29 mg/dL — AB (ref 0.60–1.30)
Co2: 17 mmol/L — ABNORMAL LOW (ref 21–32)
GFR CALC AF AMER: 35 — AB
GFR CALC NON AF AMER: 29 — AB
Glucose: 114 mg/dL — ABNORMAL HIGH (ref 65–99)
OSMOLALITY: 290 (ref 275–301)
Potassium: 5.6 mmol/L — ABNORMAL HIGH (ref 3.5–5.1)
Sodium: 138 mmol/L (ref 136–145)

## 2014-06-10 LAB — BASIC METABOLIC PANEL
Anion Gap: 11 (ref 7–16)
BUN: 48 mg/dL — AB (ref 7–18)
CALCIUM: 7.4 mg/dL — AB (ref 8.5–10.1)
CO2: 20 mmol/L — AB (ref 21–32)
Chloride: 108 mmol/L — ABNORMAL HIGH (ref 98–107)
GLUCOSE: 92 mg/dL (ref 65–99)
Osmolality: 290 (ref 275–301)
Potassium: 3.9 mmol/L (ref 3.5–5.1)
SODIUM: 139 mmol/L (ref 136–145)

## 2014-06-10 LAB — CREATININE, SERUM
Creatinine: 2.45 mg/dL — ABNORMAL HIGH (ref 0.60–1.30)
EGFR (Non-African Amer.): 27 — ABNORMAL LOW
GFR CALC AF AMER: 32 — AB

## 2014-06-11 LAB — BASIC METABOLIC PANEL
Anion Gap: 10 (ref 7–16)
BUN: 49 mg/dL — AB (ref 7–18)
CALCIUM: 7.7 mg/dL — AB (ref 8.5–10.1)
CREATININE: 2.28 mg/dL — AB (ref 0.60–1.30)
Chloride: 107 mmol/L (ref 98–107)
Co2: 19 mmol/L — ABNORMAL LOW (ref 21–32)
EGFR (African American): 35 — ABNORMAL LOW
EGFR (Non-African Amer.): 29 — ABNORMAL LOW
Glucose: 99 mg/dL (ref 65–99)
Osmolality: 285 (ref 275–301)
Potassium: 4 mmol/L (ref 3.5–5.1)
SODIUM: 136 mmol/L (ref 136–145)

## 2014-06-11 LAB — HEMOGLOBIN: HGB: 12.4 g/dL — AB (ref 13.0–18.0)

## 2014-07-03 ENCOUNTER — Ambulatory Visit: Payer: Self-pay | Admitting: Oncology

## 2014-07-03 DEATH — deceased

## 2014-11-23 NOTE — Consult Note (Signed)
PATIENT NAME:  Dillon Davis, Dillon Davis MR#:  [id number] DATE OF BIRTH:  10-29-1923  DATE OF CONSULTATION:  01/09/2013  REFERRING PHYSICIAN:  Robbins Sink, MD   CONSULTING PHYSICIAN:  Isaias Cowman, MD  REASON FOR REFERRAL:  Atrial flutter.  CHIEF COMPLAINT: "I have swelling in my feet."   HISTORY OF PRESENT ILLNESS: The patient is an 81 year old gentleman with known history of sick sinus syndrome, status post pacemaker. The patient has metastatic prostate cancer with retroperitoneal lymphadenopathy followed by Dr. Oliva Bustard. The patient apparently has had recent pedal edema and was treated with Demadex.  He presented to [facility] with generalized weakness and fatigue. The admission labs were notable for BUN and creatinine of 51 and 2.59, respectively. Potassium was 2.9.  Troponin was borderline elevated at 0.06. The patient denies chest pain, palpitations, or heart racing.   PAST MEDICAL HISTORY: 1.  Sick sinus syndrome, status post dual-chamber pacemaker 04/15/10.  2.  Hypertension.  3.  Metastatic prostate cancer with retroperitoneal lymphadenopathy.   MEDICATIONS:  Klor-Con 20 mEq daily, felodipine 10 mg daily, hydrochlorothiazide/losartan 1 daily, Demadex 20 mg daily, simvastatin 20 mg daily, prednisone 5 mg b.i.d., fish oil caps 1200 mg, 4 caps q. a.m., 4 caps q. p.m., Zytiga 250 mg 3 tablets daily, gabapentin 300 mg b.i.d., Zolpidem 5 mg at bedtime.   SOCIAL HISTORY: The patient is married, lives with his wife, denies tobacco abuse.   FAMILY HISTORY: No immediate family history for coronary artery disease or myocardial infarction.   REVIEW OF SYSTEMS:   CONSTITUTIONAL: No fever or chills.  EYES: No blurry vision.  EARS: No hearing loss.  RESPIRATORY: No shortness of breath.  CARDIOVASCULAR: No chest pain or palpitations.  GASTROINTESTINAL: No nausea, vomiting, diarrhea, constipation.  GENITOURINARY: No dysuria or hematuria.  ENDOCRINE: No polyuria or polydipsia.   MUSCULOSKELETAL: No arthralgias or myalgias.  NEUROLOGICAL: No focal muscle weakness or numbness.  PSYCHOLOGICAL: No depression or anxiety.   PHYSICAL EXAMINATION: HEENT: Pupils are equal and reactive to light and accommodation.  NECK: Supple without thyromegaly.  LUNGS: Clear.  CARDIOVASCULAR: Normal JVP. Normal PMI. Regular rate and rhythm. Normal S1, S2. No appreciable gallop, murmur or rub.  ABDOMEN: Soft and nontender.  EXTREMITIES: There was 1+ bilateral pedal edema.  MUSCULOSKELETAL: Normal muscle tone.  NEUROLOGIC: The patient is alert and oriented x 3. Motor and sensory are both grossly intact.   IMPRESSION: An 81 year old gentleman with known history of sick sinus syndrome, status post pacemaker with metastatic prostate cancer and retroperitoneal lymphadenopathy who presents with generalized weakness and fatigue with hyperkalemia and elevated BUN and creatinine, all consistent with acute on chronic renal failure with over-diuresis with Demadex. EKG reveals atrial flutter with a controlled ventricular rate. The patient currently appears completely asymptomatic. Troponin is borderline elevated, likely due to demand/supply ischemia, not due to acute coronary syndrome.   RECOMMENDATIONS: 1.  I agree with overall current therapy.  2.  I would defer full-dose anticoagulation.  3.  Hold Demadex. 4.  Gentle IV fluid repletion.        5.  Replete potassium and possibly magnesium.  6.  Review 2-D echocardiogram.   ____________________________ Isaias Cowman, MD ap:cb D: 01/09/2013 17:50:12 ET T: 01/09/2013 21:48:24 ET JOB#: [id number]  cc: Isaias Cowman, MD, <Dictator> Isaias Cowman MD ELECTRONICALLY SIGNED 01/16/2013 12:46

## 2014-11-23 NOTE — H&P (Signed)
PATIENT NAME:  Dillon Davis, Dillon Davis MMR#:  [id number] DATE OF BIRTH:  1924/02/07  DATE OF ADMISSION:  01/09/2013  CHIEF COMPLAINT: Generalized weakness.   PRIMARY CARE PHYSICIAN: Dr. Kary Kos.  CARDIOLOGIST: Dr. Saralyn Pilar    CANCER DOCTOR: Dr. Oliva Bustard.   HISTORY OF PRESENT ILLNESS: Dillon Davis is a very nice 81 year old gentleman who has history of prostate cancer. The patient has also history of edema, postherpetic neuralgia and recently started on gabapentin also on Demadex due to the edema. He also has a history of sick sinus rhythm for which he has a pacemaker and hypercholesterolemia.   The patient comes today complaining of weakness that it started about two weeks ago, mild tolerable that progressed through the day; up to today, the patient felt really bad and decided to come to the ER. The patient states that he had a fall in the bathroom last week.  He slipped and fell.  Did not break anything, did not have any significant disorders. He was just very weak and he sleeps on the back. 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  He stated he is eating okay; three meals a day, well balanced and he is sleeping 6 or 7 hours at night. He does take a pill to go to sleep.   The patient has been constipated all of his life, he says. No changes in his bowels. No diarrhea.  The patient states that he does not feel any significant weakness on any specific area; it is just  generalized everywhere. He has swelling of his feet, for which he has been started on diuretics 2 to 3 weeks ago. He has been urinating good amounts. 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  CT scan of the head was done previously by Dr. Manuella Ghazi, who is his neurologist, without any significant abnormalities.   This has been documented, but  the patient has increasing problems with dementia. He has been diagnosed with gross retroperitoneal lymphadenopathy for which he has further evaluation.   The patient admitted for evaluation to replace his electrolytes and hydrate him as he is in acute kidney injury.   REVIEW OF SYSTEMS:   GENERAL: Positive weakness. Positive fatigue.   ENT: Positive postherpetic neuralgia of the 5th nerve.   No vision changes. No difficulty swallowing.   NECK: No edema, pain or masses.   RESPIRATORY: No significant cough, shortness of breath or respirations and cardiac no chest pain. No dyspnea. No palpitations. The patient has EKG shows atrial flutter. He does have permanent pacemaker.   GASTROINTESTINAL: No nausea, vomiting. The patient has chronic constipation. No diarrhea.   GENITOURINARY: No dysuria, hematuria. Increased urine frequency due to diuretics.   MUSCULOSKELETAL: No significant pain of joints, swelling of joints. He does have history of gout, but no exacerbations.   EXTREMITIES: Positive lower extremity swelling.   SKIN: No rashes, petechiae or new moles.   NEUROLOGIC: Positive progressing of dementia and neuralgia of the 5th nerve.  No significant headaches.   ENDOCRINE: No polyuria, polydipsia or polyphagia.   PSYCHIATRIC: No significant anxiety or depression.  Positive dementia.   ALLERGIES: THE PATIENT IS ALLERGIC TO CODEINE, IT GIVES HIM NAUSEA AND SULFA DRUGS, WHICH THE REACTION IS UNKNOWN.   PAST MEDICAL HISTORY:  1. Gout.  2. Prostate cancer.  3. Sick sinus rhythm.  4. History of retroperitoneal lymphadenopathy recently diagnosed.  5. Hypertension.  6. Hypercholesterolemia.   PAST SURGICAL HISTORY:  Pacemaker insertion in 2011, prostate biopsy 50 years ago and appendectomy. He also had a history of falling trauma falling from a barn and having multiple fractures lying down in the hospital bed for over three months, that was at the age of 46.   FAMILY HISTORY:  Positive for myocardial infarction in his father at the age of 79. He died at the age of 95 from old age. No coronary artery disease.  No diabetes. No hypertension.   SOCIAL HISTORY: The patient does not smoke. He smoked whenever he was a teenager for 3 to 4 years and then quit. He has not drank in the last 65 years. He is retired. He lives with his wife.   MEDICATION:  1. Demodex 20 mg once daily.  2. Prednisone 5 mg 2 times a day for 30 days due to the postherpetic neuralgia.  3. Potassium 20 mEq once a day.  4. Amlodipine 10 mg once a day.  5. Fish oil 1200 mg caps every 4 in the morning and 4 in the evening.  6. Hydrochlorothiazide with losartan 25/100 mg in the a.m.  7. Simvastatin 20 mg 1 p.o. once a day. 8. Zytiga 250 mg take 3 tablets once a day, this is for prostate cancer chemotherapy.  9. Gabapentin 300 mg twice daily. 10. Ambien 5 mg at bedtime.   PHYSICAL EXAMINATION: VITAL SIGNS: Blood pressure 113/64, on admission 80/55. Temperature 97.6, pulse 95, respirations 18, pulse oximetry 95% on room air.   GENERAL: The patient is alert, oriented x 3. No acute distress. No respiratory distress. Hemodynamically stable.   HEENT: Pupils are equal and reactive. Extraocular movements are intact.   HEAD: Normocephalic, atraumatic. Nose, no lesions or drainage.  Ears, no significant external lesions. Mouth, no thrush. No oropharyngeal exudates.   NECK: Supple. No JVD. No thyromegaly. No adenopathy. No masses. No bruits. No rigidity.  RESPIRATORY: No distress without any wheezing or crepitus. No use of accessory muscles.   CARDIOVASCULAR: Regular rate and rhythm. No murmurs, rubs or gallops are appreciated at this moment. No displacement of PMI. No tenderness to palpation anterior chest wall.   ABDOMEN: Soft, nontender, nondistended. No hepatosplenomegaly. No masses. Bowel sounds are positive.   GENITAL: Negative for external lesions.   EXTREMITIES: No edema. Is corrected.    EXTREMITIES: Positive edema +2 to 3. Pulses +1. Sensation is normal distally.   MUSCULOSKELETAL: No significant joint deformity, effusions or edema. No signs of gout exacerbation.   SKIN: Without any rashes or petechiae. Dry skin at the level of the feet. Apparently there are some marks of the skin dilation and then further contraction which could be due to bigger edema in the past.   LYMPHATIC: Negative for lymphadenopathy in supraclavicular area or neck.   NEUROLOGIC: Cranial nerves II through XII intact. Strength is 5/5 in all 4 extremities. Deep tendon reflexes +2. Finger-to-nose test is normal. Rapid alternating movements are normal.   PSYCHIATRIC: No significant alteration of mental status. No significant agitation.   LABORATORY, DIAGNOSTIC AND RADIOLOGICAL DATA: Urinalysis; no signs of urinary infection, negative leukocyte esterase, negative nitrites. White count 7.8, hemoglobin 12.9, platelet count 195. Troponin is slightly elevated at 0.06 on the total CK 84, total protein 6, albumin is 3, other LFTs within normal limits. His glucose is 100, BUN is 51 creatinine is 2.5. He  does have chronic kidney disease, but his baseline creatinine is around 1.7 to 2. Today, his creatinine is 2.59. Potassium is 2.9.  GFR is around 20. Magnesium was 2.1.   EKG: Atrial flutter. CT of the head, age-appropriate atrophy and there is some chronic microvascular ischemic disease. No other acute abnormalities.   ASSESSMENT AND PLAN: This is a very nice 81 year old gentleman with history of prostate cancer, gout, hypertension, hyperlipidemia, peripheral edema and history of postherpetic neuralgia comes today with weakness progressive.  1. Generalized weakness. The patient is admitted for this reason due to need evaluation with a probe. Differential diagnosis electrolyte abnormalities as his potassium is 2.9, will replace the potassium.  2. Intravascular volume depletion and slight dehydration. The patient was  severely hypotensive and systolics in the 16X better now with IV fluids. We are going to get orthostatics as well as  holding blood pressure medications: Holding diuretics.  3. Effects of diuretic as the patient has low blood pressure acute kidney injury and hypokalemia.  4. At this moment, we are going to try to address his medications; stop the diuretic for now or maybe change it for something else.  Hold ACE inhibitor and hold his  hydrochlorothiazide.  5. We are going to obtain a cardiology consultation Dr. Saralyn Pilar knows his case and I let know Dr. Oliva Bustard, who is his cancer doctor, to come and check on him.  There are possibilities that this could be a reactivation of his cancer as he also has lymph nodes on the retroperitoneal area.  6. Hypokalemia severe due to Demadex replace and recheck later on today.  7. Dehydration. Continue IV fluids.  8. Acute kidney injury. The patient has significant elevation of his creatinine, He has signs of intravascular volume depletion with a good elevation of the BUN. We are going to check an ultrasound of the kidneys as the patient has history of prostate cancer and it could be some distal outlet blockage, but most likely this is the effect of diuretics.  9. Prostate cancer. Continue treatment with same medications. Follow up with Dr. Oliva Bustard. 10. Atrial flutter. The patient does have a history of atrial flutter for what we are going to consult Dr. Saralyn Pilar to follow up on that, since this could be related to electrolytes abnormalities for what we are going to keep him on telemetry.  11. The patient has significant edema, possible diastolic dysfunction, although he does not have an echo on chart. We are going to go ahead and get one today.  12. History of sick sinus syndrome. The patient on pacemaker, heart rate in the 80s.  13. Lymphadenopathy in the retroperitoneal area. This could be related to reactivation of his prostate cancer. He is undergoing an outpatient  evaluation with Dr. Oliva Bustard, we will continue that.  14.  A slight increase in troponin. This could be troponin leak due to the acute kidney injury.  We are just going to monitor his cycle troponin.  15. Anemia of chronic disease. At this moment, hemoglobin is 12.9 and this is pretty stable.  16. Deep vein thrombosis prophylaxis with heparin. Gastrointestinal prophylaxis with Protonix.   The patient is a full code so far, at this moment he is stable.  We are going to admit and let him follow up with cardiology and oncology here. Replace his electrolytes and monitor closely.   TIME SPENT: I spent about 50 minutes with this patient.  ____________________________ Radford Sink, MD rsg:rw D: 01/09/2013 15:31:44 ET T: 01/09/2013 16:55:03  ET JOB#: [id number]  cc: [place] Sink, MD, <Dictator> Kendel Bessey America Brown MD ELECTRONICALLY SIGNED 01/17/2013 12:33

## 2014-11-23 NOTE — Discharge Summary (Signed)
PATIENT NAME:  Dillon Davis, Dillon Davis MR#:  [id number] DATE OF BIRTH:  1923-09-08  DATE OF ADMISSION:  01/09/2013 DATE OF DISCHARGE:  01/10/2013  For a detailed note, please take a look at the history and physical done on admission by Dr. Laurin Coder on admission.   DIAGNOSES AT DISCHARGE: As follows:  1.  Generalized weakness secondary to hypokalemia and underlying prostate cancer.  2.  Hypokalemia.  3.  History of chronic kidney disease, stage III.  4.  Metastatic prostate cancer.  5.  Atrial flutter.  6.  Chronic elevated troponin secondary to poor renal clearance and A. flutter.  7.  Hyperlipidemia.   DIET:  The patient is being discharged on a low sodium, low fat diet.   ACTIVITY: As tolerated.   FOLLOW-UP: With Dr. Clair Gulling [facility] in the next 1 to 2 weeks. Also the patient is being referred to nephrology as a new patient appointment with Dr. Juleen China in the next 1 to 2 weeks.   DISCHARGE MEDICATIONS:  Felodipine 10 mg daily, fish oil 1200 mg four caps in the morning four caps in the evening, hydrochlorothiazide/losartan 25/100 mg 1 tab daily, simvastatin 20 mg at bedtime, Zytiga 250 mg 3 tabs daily, potassium 20 mEq daily, gabapentin 300 mg b.i.d., Ambien 5 mg 1/2 tab at bedtime, prednisone 5 mg b.i.d., Demadex 10 mg daily, bisacodyl 5 mg daily.   CONSULTANTS DURING THE HOSPITAL COURSE: Dr. Miquel Dunn from cardiology.   PERTINENT STUDIES DONE DURING THE HOSPITAL COURSE: An ultrasound of the kidneys done showing no hydronephrosis, retroperitoneal mass-like structure consistent with lymphadenopathy.   BRIEF HOSPITAL COURSE: This is an 81 year old male with multiple medical problems as mentioned above, presented to the hospital with generalized weakness and noted to be and atrial flutter and also noted to have a mildly elevated troponin.  1.  Generalized weakness. This was likely multifactorial in nature related to hypokalemia and underlying prostate cancer. The patient was given some gentle IV  fluid hydration and his hypokalemia was corrected and his weakness has improved. The patient was seen by physical therapy and they recommended home health physical therapy, which is being arranged for him prior to discharge.  2.  Hypokalemia. This was secondary to dehydration and poor oral intake and concomitant use of diuretics. His potassium has since then been corrected and resolved.  3.  History of prostate cancer. The patient is followed by Dr. Oliva Bustard. He will continue follow-up with him. He will continue his Zytiga as stated.  4.  Atrial flutter. As per cardiologist, this is chronic in nature for the patient. He is currently rate controlled and asymptomatic. Dr. Saralyn Pilar to discuss with the patient about long-term anticoagulation, but the patient was hesitant. At this point, she will discontinue baby aspirin and stay off rate controlling meds. He will continue follow-up Dr.  Saralyn Pilar as an outpatient.  5.  Chronic kidney disease, stage III. The patient's creatinine is close to baseline around 2.0. I did refer him to nephrology as an outpatient for follow-up for his chronic kidney disease.  6.  Hyperlipidemia. The patient was maintained on simvastatin. He will resume that.   CODE STATUS: The patient is a full code.   DISPOSITION: He is being discharged home with home health physical therapy services.   TIME SPENT: 40 minutes.   ____________________________ Belia Heman. Verdell Carmine, MD vjs:cc D: 01/10/2013 16:34:46 ET T: 01/10/2013 18:08:48 ET JOB#: [id number]  cc: Belia Heman. Verdell Carmine, MD, <Dictator> Irven Easterly. Kary Kos, MD Mamie Levers, MD [place]  MD ELECTRONICALLY SIGNED 01/20/2013 20:46

## 2014-11-23 NOTE — Consult Note (Signed)
Brief Consult Note: Diagnosis: AFL with controlled rate secondary to dehydration, borderline elevated troponin, probable demand supply ischemia without MI, acute on chronic renal failure.   Patient was seen by consultant.   Consult note dictated.   Comments: REC  Agree with current therapy, defer full dose anticoagulation, IVF, replete K and Mg, reivew echo.  Electronic Signatures: Isaias Cowman (MD)  (Signed 09-Jun-14 17:52)  Authored: Brief Consult Note   Last Updated: 09-Jun-14 17:52 by Isaias Cowman (MD)

## 2014-11-24 NOTE — Discharge Summary (Signed)
PATIENT NAME:  Dillon Davis, Dillon Davis MR#:  [id number] DATE OF BIRTH:  11/05/1923  DATE OF ADMISSION:  06/05/2014 DATE OF DISCHARGE:  06/11/2014  ADDENDUM  Please refer to discharge summary done by Dr. Benjie Karvonen on November 4th. The patient was kept in the hospital due to renal function worsening and urinary retention. He had a Foley placed with renal function now being stable. He has been seen by palliative care. At this point, his prognosis is very poor, and he needs to have continued treatment as doing previously.   DISCHARGE MEDICATIONS: Zytiga 250 three tabs daily, simvastatin 20 at bedtime, prednisone 5 mg 1 tab p.o. b.i.d., sertraline 25 at bedtime, gabapentin 300 one tab p.o. t.i.d., Apixaban 2.5 mg one tab p.o. b.i.d., Megace 40 one tab p.o. daily, Ensure 1 can b.i.d., Flomax 0.4 daily.   DISCHARGE DIET: Low sodium.  DISCHARGE ACTIVITY: As tolerated.   DISCHARGE DIET: Mechanical soft.   DISCHARGE FOLLOWUP AND INSTRUCTIONS: Follow up with MD at the skilled nursing facility in 1 to 2 weeks and Dr. Yves Dill in 2 to 3 weeks. Keep Foley in place.  TIME SPENT ON THIS DISCHARGE: 35 minutes.  ____________________________ Lafonda Mosses Posey Pronto, MD shp:sb D: 06/11/2014 11:13:09 ET T: 06/11/2014 12:16:37 ET JOB#: [id number]  cc: Saylah Ketner H. Posey Pronto, MD, <Dictator> Alric Seton MD ELECTRONICALLY SIGNED 2014-07-18 19:29

## 2014-11-24 NOTE — Consult Note (Signed)
PATIENT NAME:  Dillon Davis, Dillon Davis MR#:  [id number] DATE OF BIRTH:  06/05/1924  DATE OF CONSULTATION:  12/09/2013  REFERRING PHYSICIAN:   CONSULTING PHYSICIAN:  Simonne Come. Inez Pilgrim, MD  HISTORY OF PRESENT ILLNESS:  Dillon Davis is an 81 year old man who is known to the [facility] and primarily seen by Dr. Oliva Bustard. Dillon Davis is followed for a prostate cancer with a significant history of currently castrate for hormone-resistant disease, Dillon Davis progressed on Lupron Dillon Davis is now on Zytiga plus prednisone. Dillon Davis has a past history of prior treatments with taxane chemotherapy. His medical history includes chronic kidney disease, atrial flutter, hyperlipidemia, sick sinus syndrome, pacemaker, gout, hypertension, hyperlipidemia, appendectomy. Dillon Davis was seen by me, evaluated, on may 8, this narrative delayed until may 9  ALLERGIES:  Elsie.   Dillon Davis was admitted with general weakness, 2 recent falls, both of which Dillon Davis stumbled and fell but no syncope or presyncope, cellulitis in the extremity, bruising in the upper extremities and soft tissue injury. Dillon Davis was started on IV fluids, vancomycin and Zosyn. Surgery consultation. It was planned for a Physical Therapy evaluation. Dillon Davis also has anemia that is attributed to malignancy and guaiac stools are being checked. O  REVIEW OF SYSTEMS:  On my system review, Dillon Davis had general weakness. The upper extremity was not hurting at the current time. Dillon Davis was not short of breath at rest but had related chronic shortness of breath. Dillon Davis denied fever, chills or sweats. Dillon Davis denied chest or abdominal pain, nausea, vomiting and diarrhea, dysuria or hematuria. No cough or wheezing. Dillon Davis denied any focal weakness. Dillon Davis describes the 2 recent falls as stumbling one time, Dillon Davis stepped in a hole in his yard. Dillon Davis had been mobile. There was no dizziness. Dillon Davis denies anxiety or depression.   PHYSICAL EXAMINATION:  VITAL SIGNS:  Stable.  GENERAL:  Alert and cooperative. NECK:  No mass in the neck and no palpable lymph nodes in  the neck.  LUNGS:  Clear. No wheezing or rales.  HEART:  Regular.  ABDOMEN:  Nontender. No palpable mass or organomegaly.  EXTREMITIES:  Dillon Davis has old pigment changes in the lower extremities and venous stasis. Dillon Davis has ulceration on the lower extremity, which are old. Dillon Davis has upper extremity edema and soft tissue injury. This was currently wrapped. Not examined by me. There is some edema in the hands.  NEUROLOGIC:  Dillon Davis was moving all extremities without any obvious focal weakness. Dillon Davis was alert, cooperative and oriented.   LABORATORY, DIAGNOSTIC, AND RADIOLOGICAL DATA:  The creatinine was 2.37. The glucose was 146, potassium was 3. Liver enzymes were unremarkable. White count was  6.3, hemoglobin 0.2, platelets were 153. The INR was 1.1.   IMPRESSION AND PLAN:  Upper extremity injury, edema, cellulitis, on antibiotics. Chronic renal failure. Dillon Davis has underlying prostate cancer. Currently no evidence that this is contributing to any pathology. Dillon Davis had previously had retroperitoneal adenopathy, Dillon Davis had been doing well on Zytiga and prednisone. Dillon Davis has an x-ray of the arm and the radiologist has reported that is a questionable abnormality of the radial head, it could be spurious but potentially this could represent a lesion related to his metastases to prostate cancer and should be repeated. I think his anemia is unremarkable given his underlying illness and history. Could check the stool guaiac. Discussed with Medicine and if vital sings unstable or any fever or signs of sepsis, they should give him stress steroids because of the Zytiga. Also, with regard to anticoagulation, although not in the  Oncology notes, the medical notes is that Dillon Davis has a long history of being on warfarin, and the INR was not therapeutic. Dillon Davis has been restarted on a higher dose of Coumadin for deep vein thrombosis prophylaxis and to prevent hypercoagulable state Dillon Davis should be at least on prophylactic doses of Lovenox or heparin. Decision was made now  not to immediately  full dose anticoagulation because of weeping of the surgical wounds. Otherwise, aside from repeating the x-ray looking for bony metastasis in the arm and continue Zytiga and prednisone, nothing acutely to add from Oncology point of view.  ____________________________ Simonne Come Inez Pilgrim, MD rgg:jm D: 12/09/2013 11:42:20 ET T: 12/09/2013 17:18:33 ET JOB#: [id number]  cc: Simonne Come. Inez Pilgrim, MD, <Dictator> Dallas Schimke MD ELECTRONICALLY SIGNED 12/11/2013 9:02

## 2014-11-24 NOTE — Consult Note (Signed)
Brief Consult Note: Diagnosis: Left arm abrasions, cellulitis and edema.   Patient was seen by consultant.   Consult note dictated.   Comments: agree with current care, nothing to suture.  Local wound care f/u in our office middle of next week.  Electronic Signatures: Sherri Rad (MD)  (Signed [phone] 18:26)  Authored: Brief Consult Note   Last Updated: 08-May-15 18:26 by Sherri Rad (MD)

## 2014-11-24 NOTE — Discharge Summary (Signed)
PATIENT NAME:  Dillon Davis, Dillon Davis MR#:  [id number] DATE OF BIRTH:  [birth year]  DATE OF ADMISSION:  06/05/2014 DATE OF DISCHARGE:  06/07/2014  ADMISSION DIAGNOSES:  1.  Acute kidney injury on chronic kidney disease.  2.  Failure to thrive.  3.  Left apical pneumothorax.  4.  Left lung base opacity.  5.  History of deep vein thrombosis.  6.  Metastatic prostate cancer.   CONSULTATIONS: Palliative care.   LABORATORY DATA: White blood cells 9.3, hemoglobin 12.3, hematocrit 39.5, platelets are 204,000. Sodium 143, potassium 4.2, chloride 109, bicarbonate 17, BUN 40, creatinine 1.85, glucose 84.   Chest x-ray shows multifocal left lower lung consolidation with stable small left pneumothorax.   HOSPITAL COURSE: An 81 year old male who presented on 06/05/2014 with weakness, inability to eat and drink. For further details, please refer to the H and P.  1.  Acute kidney injury. The patient was noted to have increase in his creatinine from baseline about 1.7, likely secondary to poor p.o. intake as the patient has not been eating very well. Nephrotoxic agents were held. He was given IV fluids. His creatinine has improved. We will continue to hold these nephrotoxic agents.  2.  Failure to thrive with poor p.o. intake. I have started Megace. Palliative care was consulted. Plan is for hospice.  3.  Left apical pneumothorax. He had a recent left thoracentesis during the last hospitalization. He is not acutely hypoxic, complaining of any dyspnea. No need for oxygen. His chest x-ray shows continued very small pneumothorax.  4.  Left lung base opacity with consolidation, likely atelectasis as he did have a recent thoracentesis.  5.  History of DVT. The patient is continued on Eliquis.   6.  Essential hypertension. His nephrotoxic agents including losartan were held. Due to low blood pressures in the hospital metoprolol was also held.  7.  History of prostate cancer. The patient is referred to hospice.   DISCHARGE  MEDICATIONS: 1.  Zytiga 250 mg 3 tablets daily.  2.  Simvastatin 20 mg at bedtime.  3.  Prednisone 5 mg b.i.d.  4.  Zoloft 25 mg at bedtime.  5.  Gabapentin 300 mg t.i.d.  6.  Eliquis 2.5 b.i.d.  7.  Megace 40 mg daily.  8.  Ensure b.i.d.   DISCHARGE DIET: Low sodium with Ensure supplement b.i.d. and mechanical soft.   DISCHARGE ACTIVITY: As tolerated.   DISCHARGE REFERRAL: Hospice.   TIME OF DISCHARGE: About 35 minutes.     ____________________________ Donell Beers. Benjie Karvonen, MD spm:at D: 06/06/2014 13:03:42 ET T: 06/06/2014 17:09:57 ET JOB#: [id number]  cc: Malky Rudzinski P. Benjie Karvonen, MD, <Dictator> Vidyuth Belsito P Keilon Ressel MD ELECTRONICALLY SIGNED 06/06/2014 21:00

## 2014-11-24 NOTE — Discharge Summary (Signed)
PATIENT NAME:  Dillon Davis, Dillon Davis MR#:  [id number] DATE OF BIRTH:  May 23, 1924  DATE OF ADMISSION:  05/31/2014 DATE OF DISCHARGE:  06/02/2014   ADMITTING DIAGNOSIS: Dyspnea.   DISCHARGE DIAGNOSES:  1.  Dyspnea, due to bilateral pleural effusions, status post left thoracentesis with 1.2 L of transudate removal, suspected liver cirrhosis of unclear etiology,  with ascites, most likely cause of pleural effusions.  2.  Left upper extremity swelling. History of left upper extremity deep vein thrombosis on Eliquis at home.  3.  History of percutaneous transluminal angioplasty (PTA) of the left subclavian vein, superior vena cava as well as left nominate vein on 05/16/2014 by Dr. Corene Cornea due. 4.  Hyperglycemia on arrival, possibly related to liver cirrhosis.  5.  History of metastatic prostate cancer, hypertension, hyperlipidemia, chronic kidney disease stage  IIIB, gout. 6.  The patient is Do Not Resuscitate.   DISCHARGE CONDITION: Guarded.   DISCHARGE MEDICATIONS: The patient is to continue her Zytiga 250 mg p.o. 2 tablets once daily, metoprolol succinate 25 mg p.o. daily, simvastatin 10 mg p.o. at bedtime, losartan 50 mg p.o. daily, prednisone 5 mg twice daily, torsemide 20 mg p.o. daily, sertraline 25 mg p.o. at bedtime, gabapentin 300 mg p.o. 3 times daily, Eliquis 2.5 mg twice daily (this is a new dose), and spironolactone 25 mg twice daily (this is a new medication).  HOME OXYGEN: None.   DIET: Low-salt, low-fat, low-cholesterol, regular consistency.   ACTIVITY LIMITATIONS: As tolerated. The patient is referred to home health physical therapy as well as Therapist, sports.  FOLLOWUP APPOINTMENTS: Dr. Maryland Pink in 2 days after discharge. The patient was also advised to follow up with GI at [facility] in approximately 1 week after discharge to investigate probable liver cirrhosis.   CONSULTANTS: Care management, social work, Dr. Izora Gala Phifer of palliative care.  RADIOLOGIC STUDIES: Chest x-ray, portable, single view,  on 05/31/2014, showing sequential pacemaker, enters from the left with leads unchanged in position, cardiomegaly, progressive  consolidation in left mid lower lung zone, which may represent infiltrate, atelectasis and/or pleural effusion. Underlying mass cannot be excluded, and this will need to be followed until complete clearance. Tiny pulmonary nodules  suggesting result of prior granulomatous disease, unchanged from 2011 exam. Calcified aorta; full extent of aorta not adequately assessed on present exam.  CT scan of the chest without contrast on 05/11/2014 showing a large left pleural effusion with consolidation of most of the left lower lobe, moderate right pleural effusion with consolidation in the portion of the lower lobe, evidence of prior granulomatous disease with multiple calcified lymph nodes, nonenlarged. Atherosclerotic changes. Pacemaker leads attached to the right heart, and minimal pericardial effusion. Centrilobular emphysematous change with bronchiectatic change bilaterally. The appearance of the liver is consistent with cirrhosis. There is moderate generalized ascites throughout the visualized upper abdomen.   Left upper extremity ultrasound 05/31/2014 revealing nonocclusive thrombus, again noted in the left internal jugular vein, which does not appear to be significantly changed as compared to prior exam, interval development of 2.1 cm left supraclavicular lymph node was noted, which most likely is inflammatory. Followup ultrasound in 3-4 weeks is recommended to ensure resolution.   Repeated chest x-ray, 1 view after thoracentesis on 06/01/2014 showed improved aeration in the left base. Left pleural effusion has resolved. No pneumothorax. Dual lead cardiac pacemaker was in place. No segmental infiltrate was noted.   Ultrasound-guided thoracentesis on the left on 10/30/ 2015. A total of approximately 1.2 L of amber-colored fluid was removed. The fluid  sample was sent for laboratory  analysis.   Echocardiogram 06/01/2014 revealing left ventricular ejection fraction by visual estimation, 60-65%, normal global left ventricular systolic function, normal right ventricular size and systolic function, mildly dilated left atrial, mild mitral valve regurgitation, mild aortic regurgitation, and normal right ventricular systolic pressure.   HOSPITAL COURSE: The patient is an 81 year old Caucasian male with past medical history significant for history of hospitalization on the 05/15/2014 through 05/19/2014 for generalized weakness as well as left upper extremity cellulitis, who presents to the hospital with complaints of shortness of breath. Please refer to Dr. Chana Bode Patel's admission note on 10/ 29/2015. On arrival to the hospital, the patient had a chest x-ray done, which was concerning for bilateral pleural effusions, and CT scan of the chest with no contrast revealed a large left pleural effusion, as well as moderate-sized right pleural effusion.   The patient's vital signs, on arrival to the hospital, patient was afebrile with temperature of 97.8, pulse 84, respiration rate 17, blood pressure 153/83, and saturation was 93% on room air.  Physical examination was unremarkable.   The patient's lab data done on arrival to the hospital on 05/31/2014, showed an elevated BUN and creatinine to 30 and 1.74, potassium level of 3.2; otherwise BMP was unremarkable. The patient's calcium level was found to be 7.7. Troponin level was 0.02. White blood cell count was normal at 7.7, hemoglobin level was 12.0 and platelet count was 284,000. Urinalysis showed 1 red blood cell, 1 white blood cell; negative for nitrites or leukocyte esterase, and no bacterial or epithelial cells were noted.   EKG revealed normal sinus rhythm at 81 beats per minute, normal axis, low voltage QRS,  a questionable anterior infarct which was already cited before 05/14/2014; abnormal EKG, but no significant changes from prior EKG  were found.   The patient was admitted to the hospital for further evaluation. His Eliquis was stopped and he proceeded to have thoracentesis on 06/01/2014. Post thoracentesis x-ray revealed resolution of left pleural effusion, after which the patient's shortness of breath also improved. It was felt that the patient's pleural effusion, bilateral pleural effusions in fact, very likely are related to ascitic fluid since CT scan of the chest with no contrast revealed likely liver cirrhosis, as well as ascitic fluid. The patient's pleural fluid test revealed no growth, and it was consistent with transudate. Since the patient had no fevers or cough or sputum production, it was felt that the patient had no infection in his lungs. We recommended to initiate him on spironolactone in addition to his usual doses of diuretics, with hopes for his pleural effusions to resolve.   His oxygen saturation was checked while he was in the hospital, and was found to be 95-97% on room air at rest. We did not think that necessarily the patient had CHF, since ascites was noted on his CT scan as mentioned above.   In regard to his left upper extremity swelling, the patient does have a history of left upper extremity deep vein thrombosis. He was receiving Eliquis, but Eliquis dose was changed to 2.5 mg twice daily dose, which would be appropriate for a gentleman of his age, and with his renal function abnormalities.   In regards to hypoglycemia, the patient presented to the hospital with hypoglycemia, the lowest level as low as 58. The patient's hypoglycemia could have been related to poor p.o. intake, as well as liver cirrhosis. The patient's  glucose level was checked while he was in the  hospital, and he had no more hypoglycemic episodes. The patient is to continue to be observed by his family, and he may benefit from  Ensure at home.   In regard to chronic medical problems such as metastatic prostate carcinoma, hypertension,  hyperlipidemia, or chronic obstructive pulmonary disease, no changes were made in his medication management.   The patient was seen by palliative care physician, Dr. Ermalinda Memos, on 06/01/2014 and DNR status was discussed, and the patient requested DNR status and son was in agreement. Order was entered, and the patient was DNR from now on.  Palliative care recommended [facility] home [facility], so the patient may transition to hospice when it is appropriate.   On the day of discharge, the patient felt satisfactory. He did not complain of any significant discomfort. He is being discharged in stable condition with the above-mentioned medications and followup. On the day of discharge, temperature was 97.4, pulse was 61, respiration rate was 20, blood pressure 67-341 systolic and  93X-90W diastolic. Oxygen saturations were 95-97% on room air at rest.   TIME SPENT: 40 minutes.    ____________________________ Theodoro Grist, MD rv:MT D: 06/02/2014 13:18:56 ET T: 06/03/2014 05:40:13 ET JOB#: [id number]  cc: Theodoro Grist, MD, <Dictator> Irven Easterly. Kary Kos, MD [place] MD ELECTRONICALLY SIGNED 07-06-14 22:51

## 2014-11-24 NOTE — Consult Note (Signed)
Brief Consult Note: Diagnosis: cancer prostate, soft tissue injury cellulitis, possible lesion of radius.   Patient was seen by consultant.   Discussed with Attending MD.   Comments: See dictated note to follow   briefly  1. If septic or bp low would give stress steroids, otherwise pred 5mg  po bid  2. As per radiology would repeat plain film elbow   3. Discussed with Dr Ether Griffins, since INR is near normal,  for dvt prophylaxis and to prevent the hypercoaguable state that may occur in first few days of coumadin, would give at least prophylactic doses of lovenox or heparin.Marland Kitchenas per Dr Ether Griffins, wound has some weeping and full dose anticoagulation is being avoided 4. Nothing else acutely from oncology, prostate cancer under control..unless radial finding is a new bone metastasis.  Electronic Signatures: Dallas Schimke (MD)  (Signed [id number] 18:24)  Authored: Brief Consult Note   Last Updated: 08-May-15 18:24 by Dallas Schimke (MD)
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
## 2014-11-24 NOTE — Consult Note (Signed)
extensive deep vein thrombosis. gentleman was admitted in the hospital with extensive deep vein thrombosis.  Patient has dementia multiple other medical problem history of carcinoma of prostate on ZYTIGA and prednisone Chart reviewed.  Vascular consultation reviewed.note to followwould follow this patient closely and monitor the progress on anti-coagulation, if needed we will arrange for venogram and further vascular consultation.  I will see this patient within 48 hours after discharge from the hospital Dictated note to follow  Electronic Signatures: Jobe Gibbon (MD)  (Signed on 06-Oct-15 08:54)  Authored  Last Updated: 06-Oct-15 08:54 by Jobe Gibbon (MD)
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
## 2014-11-24 NOTE — Consult Note (Signed)
PATIENT NAME:  Dillon Davis, Dillon Davis MR#:  [id number] DATE OF BIRTH:  04-25-1924  DATE OF CONSULTATION:  12/11/2013  REFERRING PHYSICIAN:  Srikar R. Sudini, MD CONSULTING PHYSICIAN:  Cheral Marker. Ola Spurr, MD  REASON FOR CONSULTATION: Cellulitis.   HISTORY OF PRESENT ILLNESS: This is a very pleasant 81 year old gentleman admitted May 8th after multiple falls. He had injured his left elbow and had a lot of bruising and abrasions on his left arm. This progressed to increasing upper extremity swelling and pain and he was brought to the Emergency Room. There, he was admitted and started on vancomycin and Zosyn for broad-spectrum coverage. He has been seen by surgery, who did not feel there was any need for debridement or suturing. He does have a history also of prostate cancer and was also seen by Dr. Inez Pilgrim in consultation. Since admission, the patient has had some improvement in his wound. He has been afebrile. His edema has decreased with elevation of the arm. He is possibly going to be discharged tomorrow.   PAST MEDICAL HISTORY: 1.  Prostate cancer.  2.  Chronic kidney disease.  3.  Hypokalemia.  4.  Atrial flutter.  5.  Hyperlipidemia.  6.  Sick sinus syndrome, status post pacemaker.  7.  Gout.  8.  History of retroperitoneal lymphadenopathy.  9.  Hypertension.  10.  Hyperlipidemia.  11.  Appendectomy.  12.  Prostate biopsy.   FAMILY HISTORY: Myocardial infarct in his father.   SOCIAL HISTORY: No real smoking history or alcohol abuse. He is retired. He lives with his wife.   ALLERGIES: SULFA DRUGS AND CODEINE.   CURRENT ANTIBIOTICS SINCE ADMISSION: Include vancomycin and Zosyn.   OTHER MEDICATIONS: Include gabapentin, metoprolol, acetaminophen, Colace, Zytiga, Zofran, prednisone, promethazine, senna, simvastatin, warfarin, Benadryl.   REVIEW OF SYSTEMS: Eleven systems were reviewed and were negative except as per history of present illness.   PHYSICAL EXAMINATION: VITAL SIGNS: The patient  is afebrile at 98.1, pulse 61, blood pressure 165/81, respirations 18, sat 94% on room air.  GENERAL: He is pleasant, interactive, in no acute distress, somewhat hard of hearing.  HEENT: Pupils equal, round and reactive to light and accommodation.  HEART: Regular.  LUNGS: Clear to auscultation.  ABDOMEN: Soft, nontender, nondistended.  EXTREMITIES: Left upper extremity has multiple ecchymoses and abrasions. These are covered with nonadherent dressings. They do not have any purulence. There is no evidence of spreading cellulitis or redness. They are nontender. He does have 1+ edema in his left upper extremity.  NEUROLOGIC: He is alert and oriented x 3. Grossly nonfocal neuro exam.   LABORATORY, DIAGNOSTIC AND RADIOLOGICAL DATA:  White blood count 7.1, hemoglobin 11.0, platelets 145; this was on May 9th.  BUN 28, creatinine 1.86. LFTs are normal. Troponins negative. Vancomycin level 17. Wound culture is normal skin flora.  Blood cultures x 2 negative. Imaging: Elbow x-ray showed no acute findings on the left. Forearm x-ray shows slight cortical irregularity involving the radial head. Chest x-ray mild lung disease.   IMPRESSION: An 81 year old with multiple medical problems including castration-resistant prostate cancer, on Zytiga and chronic prednisone, admitted with left upper extremity cellulitis. This appears to be responding nicely to IV vancomycin and Zosyn as well as elevation of the arm and dressings of the wound. He has had no fevers, chills or night sweats. His blood cultures and wound cultures are negative. His white blood count is normal.   RECOMMENDATIONS: 1.  At this point, I think he is markedly improved. It is unlikely he  has any resistant organisms as culture is negative. I would recommend switching at this point to Keflex to continue a 7-day total course.  2.  I recommend elevation of the arm and continued local dressing and followup with surgery and his primary care doctor. If it  worsens, he may need to extend the antibiotics but at this time I think this will heal without sequelae. Agree with Dr. Marylene Land assessment of his need for steroid dosing if he becomes septic but currently he appears quite well.   Thank you for the consult. I will be glad to follow with you. I can see the patient as an outpatient if needed as well, however, he can follow with surgery for wound care and his primary care doctor for further evaluation if needed.   ____________________________ Cheral Marker. Ola Spurr, MD dpf:cs D: 12/11/2013 86:16:83 ET T: 12/11/2013 20:01:27 ET JOB#: [id number]  cc: Cheral Marker. Ola Spurr, MD, <Dictator> Basil Buffin Ola Spurr MD ELECTRONICALLY SIGNED 12/24/2013 19:32

## 2014-11-24 NOTE — Discharge Summary (Signed)
PATIENT NAME:  [name], Dillon Davis MR#:  [id number] DATE OF BIRTH:  [birth year]  DATE OF ADMISSION:  06/05/2014 DATE OF DISCHARGE:  06/12/2014  ADDENDUM  Please refer to discharge summary on November 9. Please note date of admission was 06/05/2014 to 06/12/2014. Please refer to discharge summary done by myself and Dr. Benjie Karvonen.   On this patient, please note, he was kept in the hospital an extra day for authorization from insurance company.   DISCHARGE MEDICATIONS: He was given Imodium as needed for diarrhea, he was continued on Zytiga 250 three tablets daily, simvastatin 20 at bedtime, prednisone 5 mg 1 tablet p.o. , sertraline 25 p.o. at bedtime, gabapentin 300 mg 1 tablet p.o. t.i.d., apixaban 2.5 one tablet p.o. b.i.d., Megace 1 tablet p.o. daily, Flomax 0.4 daily.   DIET: Low-sodium. Diet consistency is mechanical soft.   ACTIVITY: As tolerated with PT evaluation and treatment.   DISCHARGE FOLLOWUP: Follow up with primary M.D. at the skilled nursing facility in 1 to 2 weeks. Follow up with Dr. Yves Dill in 2 to 3 weeks. Keep Foley in place.   TIME SPENT ON DISCHARGE: 35 minutes.     ____________________________ Lafonda Mosses Posey Pronto, MD shp:JT D: 06/13/2014 08:23:10 ET T: 06/13/2014 11:26:40 ET JOB#: [id number]  cc: Charlea Nardo H. Posey Pronto, MD, <Dictator> Alric Seton MD ELECTRONICALLY SIGNED 06-23-14 19:31

## 2014-11-24 NOTE — Discharge Summary (Signed)
PATIENT NAME:  Dillon Davis, STANKEY MR#:  [id number] DATE OF BIRTH:  05/07/1924  DATE OF ADMISSION:  05/07/2014 DATE OF DISCHARGE:  05/08/2014  ADMISSION DIAGNOSIS: Left internal jugular deep vein thrombosis.   DISCHARGE DIAGNOSES: 1.  Left internal extensive jugular deep vein thrombosis.  2.  History of prostate cancer.  3.  Sick sinus syndrome status post pacemaker. SSS  DIAGNOSTIC DATA: Ultrasound showed a DVT filling the left internal jugular vein and partially filling the proximal left subclavian vein and left axillary vein and thrombosis is nonocclusive at this time.   Pertinent laboratories at discharge: White blood cells 6.3, hemoglobin 11.2, hematocrit 35.5, platelets 186,000. Sodium 144, potassium 3.4, chloride 112, bicarb 26, BUN 31, creatinine 1.47, glucose 87.   DISCHARGE PHYSICAL EXAMINATION:  VITAL SIGNS: Temperature 98.3, pulse 64, respirations 20, blood pressure 115/66, 96% on room air.  GENERAL: The patient is alert, not in acute distress.  LUNGS: Clear to auscultation without crackles, rales, rhonchi or wheezing. Normal to percussion.  HEART: Regular rate and rhythm. There is a 2/6 systolic ejection murmur heard best at the left sternal border. PMI is not displaced.  ABDOMEN: Bowel sounds are positive. Nontender, nondistended. No hepatosplenomegaly. EXTREMITIES: No clubbing, cyanosis or edema.  LEFT ARM: Decreased swelling. He has bruising all over both arms.   HOSPITAL COURSE: A very pleasant 81 year old male who presented with left arm swelling, noted to have a left internal jugular DVT, as mentioned above, and started heparin drip. For further details, please refer to admission H and P.  1.  Left jugular/subclavian DVT. The patient has improved with elevation and initiation of anticoagulation. He was started on heparin. We had vascular surgery see the patient in consultation regarding whether or not thrombolysis or thrombectomy would be an option; however, it would be an option,  as per Dr. Lucky Cowboy, however, given his chronic kidney disease and other issues there is increased risk of bleeding and since he is improved with anticoagulation their recommendations were for ongoing anticoagulation. I have spoken with the patient about side effects, alternatives, benefits, and risks of anticoagulation, not only high risk for subdural hematomas but also internal bleeding. The patient accepts these risks and he has been started on p.o. anticoagulation, and he is tolerating this well. He will follow up on Thursday with Dr. Oliva Bustard. The culprit for his left IJ DVT was thought to be perhaps secondary to his pacemaker causing central venous stenosis. Dr. Lucky Cowboy also mentioned that we could consider a venogram in several weeks to treat the underlying central venous issue that led to the clot, but the patient may have more problems with chronic arm swelling with that course of therapy.  2.  Metastatic prostate cancer. I spoke with Dr. Oliva Bustard. The patient will be seen on Thursday with Dr. Oliva Bustard. He will continue prednisone and Zytiga.  3.  Chronic kidney disease, stage III. The patient's creatinine is at baseline. 4.  Essential hypertension. On metoprolol and losartan.  5.  Hypokalemia without any EKG changes. The patient's potassium was replaced and there were no issues. His hypokalemia is resolved.   DISCHARGE MEDICATIONS: 1.  Zytiga 250 mg 3 tablets daily 2 hours before eating or 1 hour after eating.  2.  Metoprolol 25 mg daily.  3.  Simvastatin 20 mg at bedtime.  4.  K-Chlor 10 mEq daily.  5.  Losartan 50 mg at bedtime.  6.  Prednisone 5 mg b.i.d.  7.  Torsemide 20 mg daily.  8.  Aspirin 81 mg  daily.  9.  Acetaminophen/hydrocodone 325/5 q. 4 hours p.r.n. pain.  10.  Gabapentin 300 mg at bedtime.  11.  Eliquis 2 tablets b.i.d. for 6 days then 5 mg p.o. b.i.d.   DISCHARGE DIET: Low sodium.   DISCHARGE ACTIVITY: As tolerated.   DISCHARGE FOLLOWUP: The patient will follow up on Thursday with  Dr. Oliva Bustard and then he should follow up with Dr. Lucky Cowboy in approximately 2 weeks after.  The patient was stable for discharge.   TIME SPENT: Approximately 35 minutes.  ____________________________ Donell Beers. Benjie Karvonen, MD spm:sb D: 05/08/2014 11:43:42 ET T: 05/08/2014 12:01:07 ET JOB#: [id number]  cc: Tanmay Halteman P. Benjie Karvonen, MD, <Dictator> Algernon Huxley, MD Martie Lee. Oliva Bustard, MD Donell Beers Anissa Abbs MD ELECTRONICALLY SIGNED 05/08/2014 13:35

## 2014-11-24 NOTE — Consult Note (Signed)
   Comments   I met with pt's son from [place]. He says that son in [place] is going to move pt and his wife to his home in the next several weeks. Family will hire caregivers to be at home with pt until they move. Family would like for pt to have STR prior to d/c home. PT eval pending.   Electronic Signatures: Layah Skousen, Izora Gala (MD)  (Signed [phone] 13:15)  Authored: Palliative Care   Last Updated: 04-Nov-15 13:15 by Ysidra Sopher, Izora Gala (MD)

## 2014-11-24 NOTE — H&P (Signed)
PATIENT NAME:  Dillon Davis, Dillon Davis MR#:  [id number] DATE OF BIRTH:  02-Sep-1923  DATE OF ADMISSION:  05/31/2014  PRIMARY CARE PROVIDER: Maryland Pink, MD.   EMERGENCY DEPARTMENT REFERRING PHYSICIAN: Larae Grooms, MD.   CHIEF COMPLAINT: Shortness of breath, bilateral effusions.   HISTORY OF PRESENT ILLNESS: The patient is an 81 year old, white male who was recently hospitalized on 10/13 and then discharged on 10/17. At that time, he was admitted with generalized weakness and thought to have cellulitis of the left upper extremity. The patient does have a history of DVT. During that hospitalization, he did undergo a venogram, which showed stenosis which was dilated. The patient was discharged home. At that time, his chest x-ray showed no evidence of effusion. The patient apparently has a home health nurse that was checking on him and noticed that he was breathing heavy, so they sent him to the ED. In the ER, he was evaluated with a chest x-ray and a CT scan of the chest, which the chest showed large left pleural effusion with consolidation on most of the left lower lobe, moderate right pleural effusion with consolidation in a portion of the right lower lobe was noted. Due to this, we were asked to admit the patient. The patient is a very poor historian. He states that he is feeling fine. He denies any chest pain. He feels that he might have been short of breath earlier. Denies any fevers, chills. No cough. Again, not a reliable historian.   PAST MEDICAL HISTORY: History of metastatic prostate cancer, history of atrial flutter, history of subacute left upper extremity DVT, hypertension, hyperlipidemia, chronic kidney disease.   PAST SURGICAL HISTORY: Status post pacemaker placement, prostate biopsy, appendectomy, reconstruction of the right arm.   FAMILY HISTORY: Father died at the age of 80 but had an MI.   SOCIAL HISTORY: The patient is a nonsmoker. Denies alcohol or drug use.   MEDICATIONS AT HOME:  Zytiga 250 mg 3 tablets daily, torsemide 20 daily, simvastatin 20 daily, sertraline 25 p.o. daily, prednisone 5 mg 1 tablet p.o. b.i.d., metoprolol succinate 25 p.o. daily, losartan 50 daily, gabapentin 300 mg 1 tablet p.o. 3 times a day, apixaban 5 mg 1 tablet p.o. b.i.d.   ALLERGIES: CODEINE, SULFA DRUGS AND MILK.  REVIEW OF SYSTEMS: Unobtainable due to patient being a poor historian and unable to give me any history.   PHYSICAL EXAMINATION: VITAL SIGNS: Temperature 97.8, pulse 84, respirations 17, blood pressure 153/83, O2 was 93% on room air.  GENERAL: The patient is a very poor historian.  HEENT: Head atraumatic, normocephalic. Pupils equally round, reactive to light and accommodation. There is no conjunctival pallor. No scleral icterus. Nasal exam shows no drainage or ulceration. Oropharynx is clear without any exudate.  NECK: Supple without any thyromegaly.  CARDIOVASCULAR: Regular rate and rhythm. No murmurs, rubs, clicks or gallops. PMI is not displaced.  LUNGS: Diminished breath sounds bilaterally at the bases. No accessory muscle usage.  ABDOMEN: Soft, nontender, nondistended. Positive bowel sounds x 4. No hepatosplenomegaly.  EXTREMITIES: No clubbing, cyanosis, edema.  SKIN: He has a left upper extremity which is chronically swollen and erythematous with some blister which is unchanged from previously.  NEUROLOGICAL: Cranial nerves 2 through 12 grossly intact.  PSYCHIATRIC: Not anxious, depressed. MUSCULOSKELETAL: There is no erythema or swelling.  LYMPHATICS: Lymph nodes nonpalpable.  DIAGNOSTIC DATA: In the ED, CT scan of the chest without contrast showed large left pleural effusion with consolidation of the left lower lobe, moderate right pleural  effusion with consolidation in a portion of the right lower lobe, atherosclerotic changes, central lobular emphysema. Appearance of the liver is consistent with cirrhosis.   LABORATORY DATA: Glucose 77, BUN 30, creatinine 1.74. Sodium  145, potassium 3.2, chloride 107, CO2 of 24, calcium 7.7. Troponin less than 0.02. WBC 7.7, hemoglobin 12, platelet count 284,000. Urinalysis: Nitrites negative, leukocytes negative.   ASSESSMENT AND PLAN: The patient is an 81 year old with history of deep vein thrombosis, was noted to have heavy breathing by home health, sent to the emergency department and noted to have bilateral pleural effusions with consolidation.  1. Shortness of breath due to effusions, which are new. At this time, we will go ahead and treat him with intravenous Lasix. Hold his torsemide. Get a thoracentesis with pleural fluid analysis. We will also obtain echocardiogram to evaluate his heart. In light of bilateral pleural effusions, we will also check a BNP level.  2. Deep vein thrombosis. Eliquis will be on hold until his thoracentesis is complete.  3. Chronic upper extremity swelling.  4. History of prostate cancer with metastasis. Continue Zytiga and prednisone, outpatient followup.  5. Hypertension. Continue metoprolol and losartan.  6. Neuropathy. Continue Neurontin.  7. Hypoglycemia. The patient is not on any treatment for diabetes. Possibly due to poor p.o. intake. We will monitor his blood sugar, give him some D5-containing fluids overnight and treat him symptomatically.  8. Code status: Full. I will ask palliative care team to see him and address his code status. The patient has had multiple recurrent admissions in the past.  9. Case management evaluation is also needed.   TIME SPENT: 50 minutes.    ____________________________ Lafonda Mosses Posey Pronto, MD shp:TT D: 05/31/2014 19:42:55 ET T: 05/31/2014 20:08:18 ET JOB#: [id number]  cc: Caty Tessler H. Posey Pronto, MD, <Dictator>   Alric Seton MD ELECTRONICALLY SIGNED 19-Jul-2014 19:21

## 2014-11-24 NOTE — Consult Note (Signed)
   Comments   Pt's son, Miciah Covelli, called me this afternoon with concerns over pt being d/c'd to [facility]. They feel that pt was more lethagic today and more congested. I note that pt's creatinine is 2.59 (from 1.85 yesterday). Also note that PT was unable to work with pt today due to lethargy.  spoke with Dr Benjie Karvonen. Creatinine ordered for tomorrow AM to assess renal function. May need to put d/c on hold.  called family to update. No answer, message left.   Electronic Signatures: Marah Park, Izora Gala (MD)  (Signed [phone] 16:07)  Authored: Palliative Care   Last Updated: 06-Nov-15 16:07 by Cresencio Reesor, Izora Gala (MD)

## 2014-11-24 NOTE — Discharge Summary (Signed)
Dates of Admission and Diagnosis:  Date of Admission 08-Dec-2013   Date of Discharge 12-Dec-2013   Admitting Diagnosis LUE cellulitis   Final Diagnosis 1. left arm cellulitis 2. Left arm wound 3. ARF over CKD3 4. H/O DVT on Coumadin 5. HTN 6. Prostate cancer    Chief Complaint/History of Present Illness PRIMARY CARE PHYSICIAN: The patient is an 81 year old Caucasian male with past medical history significant for history of metastatic prostate carcinoma, also generalized weakness, and CKD stage III who was admitted in June 2014 for generalized weakness as well as hypokalemia who presents back to the hospital with weakness as well as falls. According to the patient, he fell down at least twice over the past 3 days. The first time he fell down while he was carrying his groceries, on the back porch. He lost balance and went spinning around and wound up on the floor. He injured his left elbow and his skin was sloughed. Yesterday, however, he was walking and watering his flowers. Again fell down and his skin was even denuded worse. He presents to the hospital with significant bruising as well as significant swelling of his left upper extremity and hospitalist services were contacted for admission.   Allergies:  Codeine: Other  Sulfa drugs: Unknown  Milk: GI Distress  TDMs:  11-May-15 11:28   Vancomycin, Trough LAB 17 (Result(s) reported on 11 Dec 2013 at 12:10PM.)  Routine Chem:  11-May-15 03:43   Glucose, Serum  121  BUN  28  Creatinine (comp)  1.86  Sodium, Serum 142  Potassium, Serum 3.5  Chloride, Serum  108  CO2, Serum 28  Calcium (Total), Serum  8.3  Anion Gap  6  Osmolality (calc) 290  eGFR (African American)  37  eGFR (Non-African American)  32 (eGFR values <52mL/min/1.73 m2 may be an indication of chronic kidney disease (CKD). Calculated eGFR is useful in patients with stable renal function. The eGFR calculation will not be reliable in acutely ill patients when serum  creatinine is changing rapidly. It is not useful in  patients on dialysis. The eGFR calculation may not be applicable to patients at the low and high extremes of body sizes, pregnant women, and vegetarians.)  Routine Coag:  11-May-15 03:43   Prothrombin 14.1  INR 1.1 (INR reference interval applies to patients on anticoagulant therapy. A single INR therapeutic range for coumarins is not optimal for all indications; however, the suggested range for most indications is 2.0 - 3.0. Exceptions to the INR Reference Range may include: Prosthetic heart valves, acute myocardial infarction, prevention of myocardial infarction, and combinations of aspirin and anticoagulant. The need for a higher or lower target INR must be assessed individually. Reference: The Pharmacology and Management of the Vitamin K  antagonists: the seventh ACCP Conference on Antithrombotic and Thrombolytic Therapy. [id number].4270 Sept:126 (3suppl): N9146842. A HCT value >55% may artifactually increase the PT.  In one study,  the increase was an average of 25%. Reference:  "Effect on Routine and Special Coagulation Testing Values of Citrate Anticoagulant Adjustment in Patients with High HCT Values." American Journal of Clinical Pathology 2006;126:400-405.)  12-May-15 04:57   Prothrombin  15.3  INR 1.2 (INR reference interval applies to patients on anticoagulant therapy. A single INR therapeutic range for coumarins is not optimal for all indications; however, the suggested range for most indications is 2.0 - 3.0. Exceptions to the INR Reference Range may include: Prosthetic heart valves, acute myocardial infarction, prevention of myocardial infarction, and combinations of aspirin and anticoagulant. The  need for a higher or lower target INR must be assessed individually. Reference: The Pharmacology and Management of the Vitamin K  antagonists: the seventh ACCP Conference on Antithrombotic and Thrombolytic Therapy. WJXBJ.4782  Sept:126 (3suppl): N9146842. A HCT value >55% may artifactually increase the PT.  In one study,  the increase was an average of 25%. Reference:  "Effect on Routine and Special Coagulation Testing Values of Citrate Anticoagulant Adjustment in Patients with High HCT Values." American Journal of Clinical Pathology 9562;130:865-784.)   PERTINENT RADIOLOGY STUDIES: XRay:    08-May-15 10:22, Forearm Left  Forearm Left   REASON FOR EXAM:    pain l arm  COMMENTS:       PROCEDURE: DXR - DXR FOREARM LEFT  - Dec 08 2013 10:22AM     CLINICAL DATA:  Fall 2 days ago.    EXAM:  LEFT FOREARM - 2 VIEW    COMPARISON:  None.    FINDINGS:  The mid and distal forearm appears intact. There is slight cortical  irregularity involving the radial head which may reflect overlap of  bone structures. The soft tissues appear edematous. Skin tear is  noted along the dorsum of the forearm.No radiopaque foreign bodies  are soft tissue calcifications.     IMPRESSION:  1. Slight cortical irregularity involving the radial head. If there  is a concern for fracture in this area recommend dedicated elbow  series radiographs.  2. Skin tears.      Electronically Signed    By: Kerby Moors M.D.    On: 12/08/2013 10:48     Verified By: Angelita Ingles, M.D.,    09-May-15 13:34, Elbow Left AP and Lateral  Elbow Left AP and Lateral   REASON FOR EXAM:    swelling  COMMENTS:       PROCEDURE: DXR - DXR ELBOW LEFT AP AND LATERAL  - Dec 09 2013  1:34PM     CLINICAL DATA:  Status post fall.  Left elbow pain.    EXAM:  LEFT ELBOW - 2 VIEW    COMPARISON:  None.    FINDINGS:  No fracture or dislocation is identified. No joint effusion is seen.  Mild spurring at the triceps tendon insertion is noted.   IMPRESSION:  No acute finding.      Electronically Signed    By: Inge Rise M.D.    On: 12/09/2013 17:05         Verified By: Ramond Dial, M.D.,  LabUnknown:    08-May-15 09:24, CT  Head Without Contrast  PACS Image   CT:  CT Head Without Contrast   REASON FOR EXAM:    fall x 2, on coumadin  COMMENTS:       PROCEDURE: CT  - CT HEAD WITHOUT CONTRAST  - Dec 08 2013  9:24AM     CLINICAL DATA:  81 year old male with multiple falls. On Coumadin.  Initial encounter.    EXAM:  CT HEAD WITHOUT CONTRAST    TECHNIQUE:  Contiguous axial images were obtained from the base of the skull  through the vertex without intravenous contrast.  COMPARISON:  12/30/2012 head CT.    FINDINGS:  Postoperative changes to the paranasal sinuses. Paranasal sinuses  and mastoids are stable and largely clear.    Stable postoperative changes to the left globe. No acute orbit or  scalp soft tissue findings. Calvarium intact.    Cerebral volume is within normal limits for age. Extensive Calcified  atherosclerosis  at the skull base. Stable chronic lacunar infarcts  to the left cerebellar hemisphere and right caudate nucleus.  Incidental small midline lipoma near the tentorial incisura. Mild  for age nonspecific white matter hypodensity elsewhere. No midline  shift, masseffect, or evidence of intracranial mass lesion. No  acute intracranial hemorrhage identified. No ventriculomegaly. No  evidence of cortically based acute infarction identified. No  suspicious intracranial vascular hyperdensity.     IMPRESSION:  No acute intracranial abnormality. Stable mild for age chronic small  vessel ischemia.      Electronically Signed    By: Lars Pinks M.D.    On: 12/08/2013 10:13         Verified By: Gwenyth Bender. HALL, M.D.,   Pertinent Past History:  Pertinent Past History PAST MEDICAL HISTORY: Significant for admission in June 2014 for generalized weakness, hypokalemia, CKD stage III, metastatic prostate carcinoma, atrial flutter, elevated troponin, and hyperlipidemia. Also history of sick sinus syndrome status post pacemaker placement in 2011, history of gout, history of retroperitoneal  lymphadenopathy, hypertension, hyperlipidemia, and appendectomy.  PAST SURGICAL HISTORY: Pacemaker placement in 2011, prostate biopsy 15 years ago, appendectomy. History of falling trauma due to falling barn, multiple fractures, at the age of 35. 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   Hospital Course:  Hospital Course * Left arm cellulitis after fall and skin tears, weeping edema - on vancomycin and zosyn. Changed to keflex by Dr. Ola Spurr. - Elevation NO bleeding or weeping from wound on day of discharge. Set up with [facility] nurse for wound care changes  * Acute on chronic renal failure- resolved - cr improving to 1.85  * weakness - PT  * hypercoagulable state and subtherapuetic INR - coumadin at higher dose F/U with PCP for INR check.  * HTN - slightly up today  * Hyperlipidemia - zocor  *  hx prostate cancer  On day of discharge Pt was Alert, oriented. Dressing removed and well healing ulcers found on Left, elbow and  forearm and skin tear on Left hand, dorsum. No bleeding.  Time spent on day of discharge 45 minutes   Condition on Discharge Fair   Code Status:  Code Status Full Code   DISCHARGE INSTRUCTIONS HOME MEDS:  Medication Reconciliation: Patient's Home Medications at Discharge:     Medication Instructions  zytiga 250 mg oral tablet  3 tab(s) orally once a day   gabapentin 300 mg oral capsule  1 cap(s) orally 2 times a day   prednisone 5 mg tablet  1 tab(s) orally 2 times a day    losartan 100 mg oral tablet  1 tab(s) orally once a day   metoprolol succinate 25 mg oral tablet, extended release  1 tab(s) orally once a day   klor-con m20 20 meq oral tablet, extended release  1 tab(s) orally once a day   simvastatin 20 mg oral tablet  1 tab(s) orally once a day (at bedtime)   coumadin 4 mg oral tablet  1 tab(s) orally once a day   torsemide 20 mg oral tablet  1 tab(s) orally once a day   cephalexin 500 mg oral  capsule  1 cap(s) orally every 8 hours    STOP TAKING THE FOLLOWING MEDICATION(S):    doxycycline hyclate 100 mg oral tablet: 1 tab(s) orally 2 times a day  Physician's Instructions:  Home Health? Yes   [place] [facility] Instructions Wound care  Diet Low Sodium   Activity Limitations As tolerated   Return to Work Not Applicable   Time frame for Follow Up Appointment 1-2 weeks  PCP within a week   Time frame for Follow Up Appointment 1-2 weeks  Wound clinic within a week   Other Comments Cleanse left elbow wound with NS and pat gently dry.Apply Mepitel, silicone contact layer to wound bed.  Top with calcium alginate for absorption, then ABD pad and kerlix.  Secure with tape.  Change daily.  Dry, scabbed lesions to remain open to air.   Electronic Signatures: Toy Samarin, Lottie Dawson (MD)  (Signed 13-May-15 15:20)  Authored: ADMISSION DATE AND DIAGNOSIS, CHIEF COMPLAINT/HPI, Allergies, PERTINENT LABS, PERTINENT RADIOLOGY STUDIES, PERTINENT PAST HISTORY, HOSPITAL COURSE, DISCHARGE INSTRUCTIONS HOME MEDS, PATIENT INSTRUCTIONS   Last Updated: 13-May-15 15:20 by Alba Destine (MD)

## 2014-11-24 NOTE — H&P (Signed)
PATIENT NAME:  Dillon Davis, Dillon Davis MR#:  [id number] DATE OF BIRTH:  1924/01/28  DATE OF ADMISSION:  06/05/2014  ADMITTING PHYSICIAN: Dillon Lighter, MD   PRIMARY CARE PHYSICIAN: Dillon Basaldua. Kary Kos, MD  CHIEF COMPLAINT: Weakness, inability to eat and drink.   HISTORY OF PRESENT ILLNESS: Dillon Davis is an 81 year old elderly Caucasian gentleman with past medical history significant for history of dementia, metastatic prostate cancer, history of DVT, atrial flutter, hypertension, CKD who was recently in the hospital and just discharged on 06/02/2014 with home health, comes back secondary to weakness. In his last admission last week, the patient had bilateral pleural effusions and had left thoracentesis done. He was seen by palliative care because of recurrent admission. He is a DNR status. He was discharged with home health [facility] so he may transition to hospice. He lives at home with his wife who also has dementia. The patient is unable to provide any history at this time. He just states he has been weak, unable to get out of bed, not eating or drinking and presented here to the hospital. His labs do indicate that his BUN and creatinine are elevated since his last discharge. So, he is being admitted for failure to thrive at this point.   PAST MEDICAL HISTORY:  1.  Metastatic prostate cancer.  2.  Recent bilateral pleural effusions.  3.  History of atrial flutter.  4.  History of DVT, left upper extremity.  5.  Hypertension. 6.  Hyperlipidemia.  7.  CKD.   PAST SURGICAL HISTORY:  1.  Pacemaker placement.  2.  Prostate surgery.  3.  Appendectomy.  4.  Right arm reconstruction surgery.   ALLERGIES TO MEDICATIONS: CODEINE, SULFA AND MILK.   HOME MEDICATIONS: He is still on the same medications that he was discharged on, which include: 1.  Eliquis 2.5 mg p.o. b.i.d.  2.  Gabapentin 300 mg p.o. 3 times a day.  3.  Losartan 50 mg p.o. daily.  4.  Metoprolol 25 mg p.o. daily.  5.  Prednisone 5 mg  p.o. b.i.d.  6.  Sertraline 25 mg p.o. at bedtime.  7.  Simvastatin 20 mg at bedtime.  8.  Spironolactone 25 mg p.o. b.i.d.  9.  Torsemide 20 mg p.o. daily.  10. Zytiga 750 mg p.o. daily.   SOCIAL HISTORY: Lives at home with wife. No smoking or alcohol use.   FAMILY HISTORY: Not known.  REVIEW OF SYSTEMS: Difficult to be obtained secondary to the patient's dementia.   PHYSICAL EXAMINATION:  VITAL SIGNS: Temperature 97.7 degrees Fahrenheit, pulse 120, respirations 18, blood pressure 145/98, pulse oximetry 99% on 2 liters oxygen.  GENERAL: Well-built, ill-nourished male lying in bed, not in any acute distress.  HEENT: Normocephalic, atraumatic. Pupils are equal, round, reacting to light. Anicteric sclerae. Extraocular movements intact. Oropharynx: Very dry mucous membranes and light oral thrush noted. No exudates or masses noted.  NECK: Supple. No thyromegaly, JVD or carotid bruits. No lymphadenopathy.  LUNGS: Moving air bilaterally. Decreased bibasilar breath sounds and some coarse rhonchi. No crackles. Minimal use of accessory muscles noted when doing exertional activity.  CARDIOVASCULAR: S1, S2, regular rate and rhythm. No murmurs, rubs or gallops.  ABDOMEN: Soft, nontender, nondistended. No hepatosplenomegaly. Normal bowel sounds.  EXTREMITIES: Left upper extremity is swollen and the right upper extremity, which is chronic for the patient. Feeble dorsalis pedis pulses palpable bilaterally. No clubbing or cyanosis.  NEUROLOGIC: Cranial nerves seem to be intact, able to move all 4 extremities in bed,  but generalized weakness noted. No focal neurological deficits which are new noted.  PSYCHOLOGICAL: The patient seems alert, not oriented.   LABORATORY DATA: WBC 8.6, hemoglobin 12.9, hematocrit 40.5, platelet count 255,000.   Sodium 143, potassium 3.2, chloride 105, bicarb 24, BUN 36, creatinine 2.19, glucose 99, calcium 7.9.   ALT 9, AST 17, alk phos 118, total bilirubin 1.1, albumin of  2.4. Troponin is negative. Magnesium 2.1. INR is 1.1. Urinalysis negative for any infection. Left knee x-ray showing no significant abnormality of the left knee and chest x-ray showing small left apical pneumothorax, small airspace density left lung base, could be pneumonia, pulmonary edema or atelectasis or contusion. CT of the head without contrast showing remote lacunar cerebral infarct on the left side, stable atrophy, white matter, no acute intracranial abnormality noted.   ASSESSMENT AND PLAN: An 81 year old male with history of metastatic prostate cancer, recent left-sided thoracentesis for pleural effusion, hypertension, history of deep venous thrombosis and Eliquis, presents to the hospital secondary to failure to thrive and dehydration.  1.  Acute renal failure on chronic kidney disease. Discharge creatinine was 1.7, now creatinine at 2.1 and also elevated BUN. The patient has poor p.o. intake so it could be prerenal, so gentle IV fluids and monitor. Hold nephrotoxins, torsemide and also losartan that the patient is on. 2.  Failure to thrive with poor oral intake. Seen by palliative care in the last admission 2 days ago. The patient is a DNR. We will place a DNR order. Will need to be seen by palliative care again to see if he would be a candidate for hospice. Not in a great living situation at this time.  3.  Left apical pneumothorax. He had left thoracentesis done 3 days ago, so maybe it is a residual from that. The patient is not acutely hypoxic and complaining of any dyspnea at this time. With a very small amount of pneumothorax, hopefully it will be just observation. Followup chest x-ray in the a.m.  4.  Left lung base opacity. Probably atelectasis after the pleural effusion was drained. We will encourage incentive spirometry. Less likely to be pneumonia. The patient is not febrile, white count is normal, so just continue to monitor at this time. We will hold off antibiotics at this time.  5.   History of deep venous thrombosis. Continue Eliquis at this point.  6.  Hypertension. Continue his metoprolol. Hold the losartan and torsemide for now.  7.  Metastatic prostate cancer. Overall poor prognosis. The patient is on Zytiga, continue that.  TIME SPENT ON ADMISSION: Fifty minutes.   CODE STATUS OF THE PATIENT: Do not resuscitate.   ____________________________ Dillon Lighter, MD rk:TT D: 06/05/2014 19:00:06 ET T: 06/05/2014 21:11:08 ET JOB#: [id number]  cc: Dillon Lighter, MD, <Dictator> Irven Easterly. Kary Kos, MD Dillon Lighter MD ELECTRONICALLY SIGNED 06/23/2014 14:47

## 2014-11-24 NOTE — Op Note (Signed)
PATIENT NAME:  Dillon Davis, Dillon Davis MR#:  [id number] DATE OF BIRTH:  1923-12-10  DATE OF PROCEDURE:  05/16/2014  PREOPERATIVE DIAGNOSES: 1. Central stenosis/thrombosis.  2. Severe bilateral upper extremity swelling and ulceration.   POSTOPERATIVE DIAGNOSES:  1. Central stenosis/thrombosis.  2. Severe bilateral upper extremity swelling and ulceration.   PROCEDURES: 1. Ultrasound guidance for vascular access to the left brachial vein.  2. Left upper extremity venogram and superior venacavogram.  3. Percutaneous transluminal angioplasty of left subclavian vein with 8, 10 and 12 mm diameter angioplasty balloon.  4. Percutaneous transluminal angioplasty superior vena cava and left innominate vein with 8, 10 and 12 mm diameter angioplasty balloon.   SURGEON: Algernon Huxley, MD.   ANESTHESIA: Local with moderate conscious sedation.   ESTIMATED BLOOD LOSS: Minimal.   INDICATION FOR PROCEDURE: An 81 year old gentleman who was diagnosed with central venous thrombosis and left upper extremity thrombosis last week and was started on anticoagulation and discharged home. He, however, has worsening of his left upper extremity swelling and now has ulceration. He has also developed right upper extremity swelling, although not as severe and worry for cellulitis of his upper extremities. For this reason, he is brought in for venogram with possible intervention. The risks and benefits were discussed. Informed consent was obtained.   DESCRIPTION OF PROCEDURE: The patient was brought to the vascular suite. The left upper extremity was sterilely prepped and draped and a sterile surgical field was created. The left brachial vein was visualized with ultrasound and found to be widely patent. It was then accessed under direct ultrasound guidance with difficulty. A micropuncture needle, micropuncture wire and sheath were placed. We upsized to a 6 [place] sheath and gave 3000 units of intravenous heparin. Imaging was performed  through the sheath, which showed a patent brachial vein and axillary vein. The subclavian vein was occluded where pacer wires were located. There were extensive venous collaterals. No thrombus was seen. There was occlusion all the way down into the superior vena cava. I was able to get a CXI catheter and a Glidewire through part of the occlusion.   There appeared to be some reconstitution of the left innominate vein, but then reocclusion in the innominate vein and into the superior vena cava, which would likely explain his right upper extremity symptoms as well. I was able to continue it across through the occlusion with the Glidewire and the CXI catheter and confirmed intraluminal flow in the superior vena cava just above the right atrium. I then placed an 0.018 wire. I predilated the lesion with a 5 mm balloon. Due to the very tight nature, I initially had difficulty getting a Kumpe catheter across and I did not think an 0.035 balloon would track. I ballooned both the superior vena cava and innominate vein, as well as the subclavian vein. These appeared to be 2 separate areas of stenoses and occlusion causing symptoms.   I then exchanged for a 0.035 wire, initially started with an 8 mm diameter angioplasty balloon, inflated in the superior vena cava and left innominate vein and then in the subclavian vein separately. With a 10 and then a 12 mm diameter angioplasty balloon, would continue to dilate the vein to match a more normal venous profile. Following this, angiogram was then performed which showed the vein now to be patent. There was still some stenosis in the 50% range at the left subclavian vein, but this now allowed brisk flow to travel through this and I did not want to  dilate any larger with the pacer wires in place. The superior vena cava and left innominate vein were patent and blood refluxed up at the right innominate vein without any obvious thrombus seen. At this point, we had ballooned both  locations and I felt we had improved his flow significantly.   I elected to terminate the procedure. The sheath was removed and 4-0 Monocryl sutures were placed at the skin site. Sterile dressings were placed. The patient tolerated the procedure well and was taken to the recovery room in stable condition.    ____________________________ Algernon Huxley, MD jsd:TT D: 05/16/2014 15:54:11 ET T: 05/16/2014 16:22:15 ET JOB#: [id number]  cc: Algernon Huxley, MD, <Dictator> Algernon Huxley MD ELECTRONICALLY SIGNED 05/19/2014 12:39

## 2014-11-24 NOTE — Discharge Summary (Signed)
Dates of Admission and Diagnosis:  Date of Admission 16-May-2014   Date of Discharge 18-May-2014   Admitting Diagnosis swelling left upper extrimity   Final Diagnosis Generalized weakness Swelling left upper extrimity due to Stenosis in vein- dilated by Vascular. Hx of DVT on left- on Eliquis. Hypertension. Prostate cancer.    Chief Complaint/History of Present Illness a 81 year old Caucasian male who is brought in by EMS due to inability to stand from sitting on the commode. The patient complained of weakness primarily in his upper extremities. EMS found him to be disoriented to time and place. On arrival to the Emergency Department, his mental state had resolved and he was aware of his surroundings and circumstances. He denied pain anywhere, but is concerned about his weeping left arm with multiple blisters and skin tears. The patient was found to have a small pleural effusion on the left and was unable to ambulate, which prompted the Emergency Department to call for admission.   Allergies:  Codeine: Other  Sulfa drugs: Unknown  Milk: GI Distress  Routine Chem:  12-Oct-15 21:36   Creatinine (comp)  1.63  14-Oct-15 04:45   Creatinine (comp)  1.51  15-Oct-15 04:50   Creatinine (comp)  1.40   Pertinent Past History:  Pertinent Past History Metastatic prostate cancer, atrial flutter, subacute left upper extremity DVT, hypertension, hyperlipidemia, and chronic kidney disease.   Hospital Course:  Hospital Course 81 yo male w/ hx of prostate cancer, hx of Left upper ext. DVT, HTN, Hyperlipidemia, Neuropathy came into hospital due to weakness and difficulty ambulating.    1. Generalized weakness - likely related to deconditioning and also underlying Prostate cancer.  - no acute infectious process.  - CT head (-) Seen by PT and they recommend SNF/STR and social work aware- working on [employer].   2. Left upper ext. swelling/blisters - likely related to undelrying  DVT.   - cont. Eliquis. seen by Vascular and done Venogram 10/14- had stenosis- dilated. No clots. - cont. Local wound care for now.   3. hx of Prostate cancer - follows w/ Dr. Oliva Bustard.  - cont. Zytiga, Prednisone  4. HTN - cont. Metoprolol, Losartan  5. hx of hyperlipidemia - cont. Simvastatin  6. neuropathy - cont. neurontin. CM working on placement to [facility]. When insurance authorization availble- will d/c to rehab.   Condition on Discharge Stable   DISCHARGE INSTRUCTIONS HOME MEDS:  Medication Reconciliation: Patient's Home Medications at Discharge:     Medication Instructions  zytiga 250 mg oral tablet  3 tab(s) orally once a day   metoprolol succinate 25 mg oral tablet, extended release  1 tab(s) orally once a day   simvastatin 20 mg oral tablet  1 tab(s) orally once a day (at bedtime)   losartan 50 mg oral tablet  1 tab(s) orally once a day (in the evening)   prednisone 5 mg oral tablet  1 tab(s) orally 2 times a day   torsemide 20 mg oral tablet  1 tab(s) orally once a day   apixaban 5 mg oral tablet  1 tab(s) orally 2 times a day   sertraline 25 mg oral tablet  1 tab(s) orally once a day (at bedtime)   gabapentin 300 mg oral capsule  1 tab(s) orally 2 times a day   gabapentin 300 mg oral capsule  1 cap(s) orally once a day (at bedtime)    STOP TAKING THE FOLLOWING MEDICATION(S):    klor-con 10 10 meq oral tablet, extended release: 1 tab(s)  orally once a day doxycycline hyclate 100 mg oral tablet: 1 tab(s) orally 2 times a day  Physician's Instructions:  Home Health? Yes   [place] [facility]  Nurse  Nurse Aid   Dressing Care A dry bandage has been applied to your wound.  Keep this bandage clean and dry.  Replace dressing as necessary.  May shower.   Diet Low Sodium  Low Fat, Low Cholesterol   Dietary Supplements Ensure   Dietary Supplements Frequency Two times per day   Activity Limitations As tolerated   Return to Work Not Applicable    Time frame for Follow Up Appointment 1-2 weeks  PMD   Time frame for Follow Up Appointment 1-2 weeks  Vascular     Leotis Pain S(Consultant): [facility] [address], [place], [place] [address], [place], Jim(Family Physician): [facility], [address], [place], [place] [address], [place] [phone]  Electronic Signatures: Vaughan Basta (MD)  (Signed 21-Oct-15 13:25)  Authored: ADMISSION DATE AND DIAGNOSIS, CHIEF COMPLAINT/HPI, Allergies, PERTINENT LABS, PERTINENT PAST HISTORY, HOSPITAL COURSE, [place], PATIENT INSTRUCTIONS, Follow Up Physician   Last Updated: 21-Oct-15 13:25 by Vaughan Basta (MD)

## 2014-11-24 NOTE — H&P (Signed)
PATIENT NAME:  Dillon Davis, Dillon Davis MR#:  [id number] DATE OF BIRTH:  07-27-1924  DATE OF ADMISSION:  05/07/2014  PRIMARY CARE PHYSICIAN: Irven Easterly. Kary Kos, MD  PRIMARY ONCOLOGIST: Martie Lee. Choksi, MD  ADMITTING PHYSICIAN: Juluis Mire, MD  CHIEF COMPLAINT: Left upper extremity swelling and oozing of clear fluid.  HISTORY OF PRESENT ILLNESS: The patient is an 81 year old Caucasian male with a past medical history significant for metastatic prostate cancer, history of CKD stage III, history of left upper extremity cellulitis in the past, hypertension, hyperlipidemia, history of atrial flutter, gout, sick sinus syndrome status post pacemaker placement, who presents with complaints of left upper extremity swelling and oozing of clear fluid for the past few days. He denies any fever. No local pain of the left upper extremity. Denies any chest pain, shortness of breath, palpitations. No nausea, vomiting, diarrhea, or constipation. No dysuria or hematuria.   The patient was evaluated by the ED physician and underwent ultrasound of the left upper extremity and was diagnosed to have extensive left upper extremity DVT involving total occlusion of the left internal jugular vein, partial occlusion of left subclavian vein, and partial occlusion of the left axillary vein. Upon consultation with oncologist, Dr. Inez Pilgrim, by the ED physician, the patient was started on a heparin drip for the treatment of DVT.   Of note, the patient was admitted in May of this year with left upper extremity swelling. At the time of discharge, he was discharged on warfarin for prophylaxis of DVT in view of his metastatic prostate cancer, but apparently it seems he was not taking Coumadin and his INR is 1.0.   PAST MEDICAL HISTORY:  1.  Metastatic prostate cancer undergoing treatment per oncology, Dr. Oliva Bustard.  2.  CKD stage III. 3.  History of left upper extremity cellulitis with admission in May 2015. 4.  Atrial flutter.  5.   Hypertension.  6.  Hyperlipidemia.  7.  Sick sinus syndrome status post permanent pacemaker. 8.  Gout. 9.  History of retroperitoneal adenopathy.  PAST SURGICAL HISTORY: Status post appendectomy.   ALLERGIES: SULFA DRUGS AND CODEINE.  HOME MEDICATIONS: Acetaminophen/hydrocodone 325/5 mg tablets, 1 tablet every 6 hours as needed, aspirin 81 mg 1 p.o. daily, gabapentin 300 mg 1 capsule at bedtime, Klor-Con 10 mEq 1 tablet orally daily, losartan 50 mg tablet once daily, metoprolol 25 mg extended release tablet once a day, prednisone 5 mg tablet 1 tablet 2 times a day, Simvastatin 20 mg once daily, torsemide 20 mg tablet orally once a day, Zytiga 200 mg oral tablet 3 tablets orally once a day 2 hours before eating.   FAMILY HISTORY: Significant for myocardial infarction in the patient's father at 47 years of age. Negative for diabetes or hypertension.   SOCIAL HISTORY: He is retired. He lives with his wife. He smoked for a very short period during his teenage years; otherwise, no smoking. Denies any alcohol or substance abuse.   REVIEW OF SYSTEMS:  CONSTITUTIONAL: No fever. Generalized weakness which is chronic present. History of frequent falls present. EYES: Denies any blurred vision or double vision; no redness, no inflammation. EARS, NOSE, AND THROAT: Negative for tinnitus, ear pain, hearing loss, epistaxis, or discharge. RESPIRATORY: Negative for cough, wheezing, hemoptysis, dyspnea, or painful respirations.  CARDIOVASCULAR: Negative for chest pain, palpitations, shortness of breath, dyspnea on exertion. No syncope.  GASTROINTESTINAL: Negative for nausea, vomiting, diarrhea, abdominal pain, hematemesis, melena, or rectal bleeding.  GENITOURINARY: Negative for dysuria or hematuria.  ENDOCRINE: Negative for polyuria,  polydipsia, heat or cold intolerance.  HEMATOLOGIC: Chronic anemia secondary to anemia of chronic disease from prostate cancer. Negative for bruising or bleeding.   INTEGUMENTARY: Chronic soreness of left upper extremity with seepage of clear fluid.  MUSCULOSKELETAL: History of gout. No joint pains at this time.  NEUROLOGICAL: Negative for focal weakness or numbness. History of ataxia with frequent falls present.  PSYCHIATRIC: Negative for anxiety or depression or insomnia.    PHYSICAL EXAMINATION:  VITAL SIGNS: Temperature 98.8 degrees Fahrenheit, pulse rate 78, respirations 18, blood pressure 142/68, oxygen saturation 98% on room air. GENERAL: Elderly Caucasian male lying in bed. Not in any acute distress. Pleasant and cooperative. Well developed.  HEENT: Eyes: Pupils equal and reactive to light. Extraocular movements intact. No scleral icterus. No conjunctival congestion or pallor. Nose: No nasal lesions, no drainage. Ears: No drainage, no external lesions. Mouth: No oropharyngeal lesions. No exudates.  NECK: Supple. No JVD. No thyromegaly. No carotid bruit. Range of motion full without any pain.  RESPIRATORY: Good respiratory effort. Clear to auscultation.  CARDIOVASCULAR: Rate regular. No murmurs.   GASTROINTESTINAL: Abdomen soft, nontender. No hepatosplenomegaly. No guarding. No rigidity.  GENITOURINARY: Deferred. MUSCULOSKELETAL: Gait not tested. Range of motion adequate in all areas. Strength and tone equal bilaterally.  SKIN: Chronic dermatosis of the left upper extremity with seepage of clear fluid present. No obvious evidence of any infection such as increased temperature or redness locally. LYMPHATIC: No cervical lymphadenopathy.  VASCULAR: Dorsalis pedis and posterior tibial 1+ bilaterally.  NEUROLOGICAL: Alert, awake, and oriented x 3. Cranial nerves II through XII are intact. DTRs are 2+ and symmetrical bilaterally.  PSYCHIATRIC: Judgement and insight are adequate. Alert and oriented x 3. Memory and mood within normal limits.   LABORATORY DATA: Serum glucose 106, BUN 35, creatinine 1.84. Serum sodium 146, potassium 3.2, chloride 111, bicarb  28. Estimated GFR 37. Total calcium 8.4. CBC with WBC 8.7, hemoglobin 12.3, hematocrit 40.7, platelets 265,000. Prothrombin 13.3, INR 1.0, PTT 31.3.   IMAGING: Ultrasound of the left upper extremity. Impression: Deep venous thrombosis noted mostly filling the left internal jugular vein and partially filling the proximal left subclavian vein and left axillary vein. This thrombus is nonocclusive at this time.   ELECTROCARDIOGRAM: Not done.  ASSESSMENT AND PLAN: An 81 year old Caucasian male with a past medical history of metastatic prostate cancer and chronic kidney disease stage III presents with complaints of left upper extremity swelling associated with dermatitis with oozing of clear fluid. Evaluated by the ED physician and diagnosed to have extensive deep venous thrombosis of the left upper extremity.  1.  Left upper extremity deep venous thrombosis involving the left internal jugular vein, left subclavian vein and left axillary vein. Nonocclusive clot. The patient is on a heparin drip per hematology.  2.  Metastatic prostate cancer on chemotherapy per oncology.  3.  Chronic kidney disease stage III.  4.  Hypokalemia with potassium of 3.2. To supplement potassium and follow up electrolytes.  5.  History of hypertension, controlled on home medications.  6.  History of sick sinus syndrome status post permanent pacemaker, stable.  7.  History of hyperlipidemia, stable on Simvastatin.  8.  History of gout, stable. No arthralgias at this time.  9.  History of atrial flutter. Patient without any cardiac symptoms at this time. Will get an EKG for documentation.  10.  The patient is started on IV heparin after the ED physician discussed the case with the oncologist on call, Dr. Inez Pilgrim. Oncology consult placed. The patient  is on close monitoring for any bleeding episodes. Discussed with the patient for the benefits and possible side effects of anticoagulation. The patient states he understands and agrees  with the plan. Fall precautions reinforced.   CODE STATUS: Full Code.  TIME SPENT: 55 minutes.    ____________________________ Juluis Mire, MD enr:MT D: 05/07/2014 04:36:49 ET T: 05/07/2014 05:05:14 ET JOB#: [id number]  cc: Juluis Mire, MD, <Dictator> Irven Easterly. Kary Kos, [place]. Oliva Bustard, MD Juluis Mire MD ELECTRONICALLY SIGNED 05/07/2014 13:04

## 2014-11-24 NOTE — Consult Note (Signed)
PATIENT NAME:  Dillon Davis, Dillon Davis MR#:  [id number] DATE OF BIRTH:  1924/02/11  DATE OF CONSULTATION:  12/08/2013   CONSULTING PHYSICIAN:  Elta Guadeloupe A. Marina Gravel, MD  HISTORY OF PRESENT ILLNESS:  An  81 year old male sustained a fall at his house 3 days ago, sustaining local trauma to his left arm. Admitted to the medical service with cellulitis, edema, open wound. Surgical services were asked to evaluate.   PHYSICAL EXAMINATION: GENERAL: He is alert and oriented.  EXTREMITIES:  Large bandage was removed off of his left arm. There was marked edema of the left hand and wrist and upper arm and forearm. There is a complex superficial degloving injury through the dermis. There is nothing really to suture. There is no active bleeding. There is no undrained purulence. Dressing was reapplied.   IMPRESSION: Local trauma, left upper extremity. I agree with your current management. No surgical indications.   ____________________________ Jeannette How Marina Gravel, MD FACS mab:dmm D: 12/08/2013 18:33:35 ET T: 12/08/2013 19:51:18 ET JOB#: [id number]  cc: Elta Guadeloupe A. Marina Gravel, MD, <Dictator> Hortencia Conradi MD ELECTRONICALLY SIGNED 12/10/2013 17:31

## 2015-10-30 IMAGING — CT CT CHEST W/O CM
2 of 3 series · 15 of 36 positions shown, 18 images · non-contrast
Comparison: Chest radiograph May 31, 2014 ; chest radiograph
May 14, 2014

CLINICAL DATA: Progressive shortness of Breath

EXAM:
CT CHEST WITHOUT CONTRAST
TECHNIQUE: Multidetector CT imaging of the chest was performed following the
standard protocol without IV contrast material administration.

[Series 2: routine chest wo · axial · 0.80mm/px · z∈[-332,-72]mm · 12 of 62 slices shown, 15 images]
[im 5/62  mediastinal]
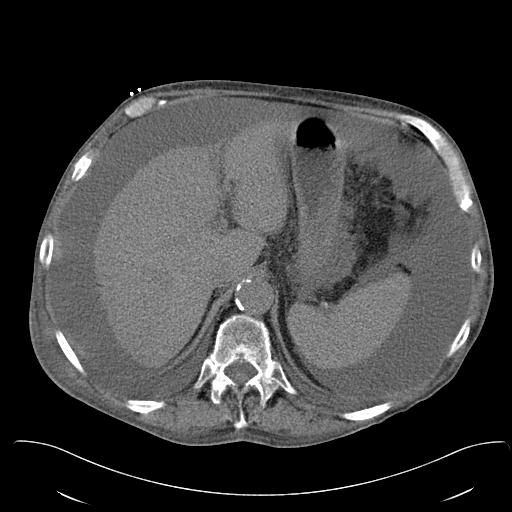
[im 5/62  lung]
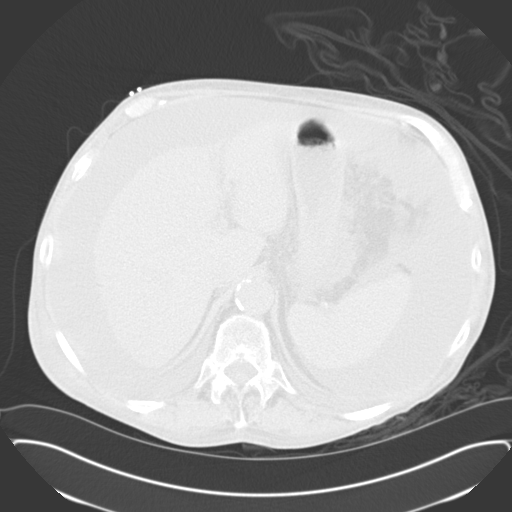
[im 10/62  lung]
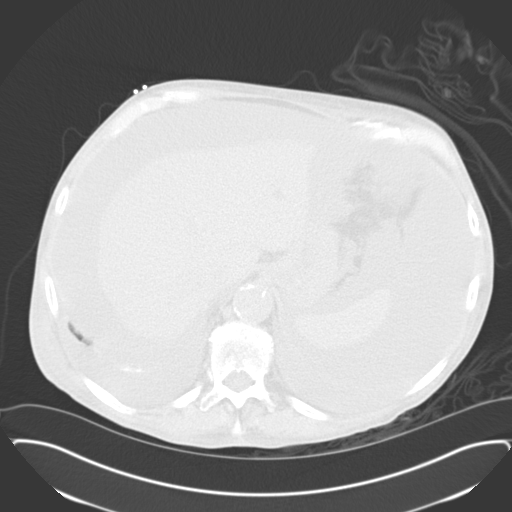
[im 14/62  lung]
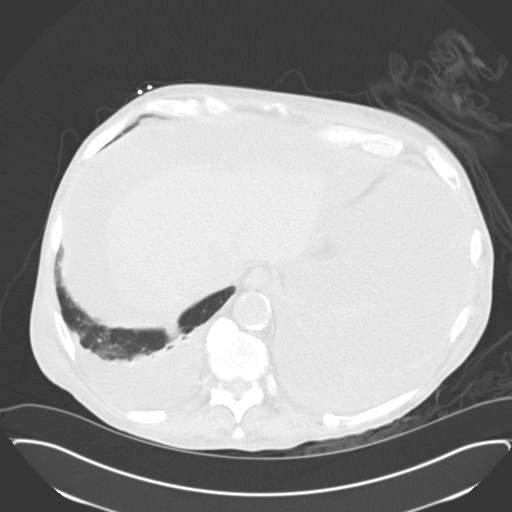
[im 19/62  lung]
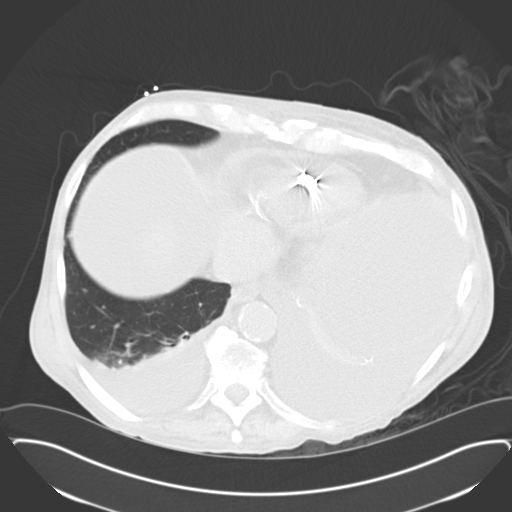
[im 23/62  mediastinal]
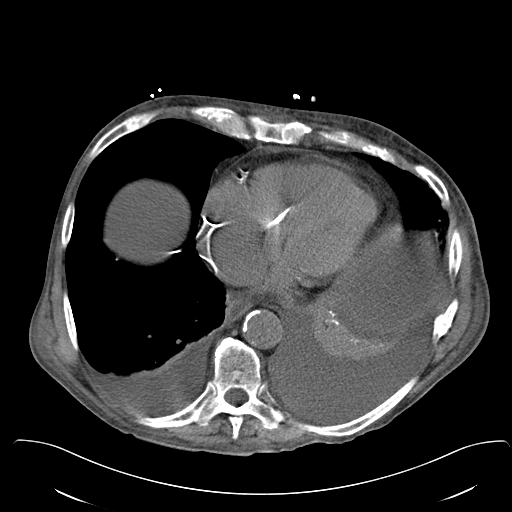
[im 23/62  lung]
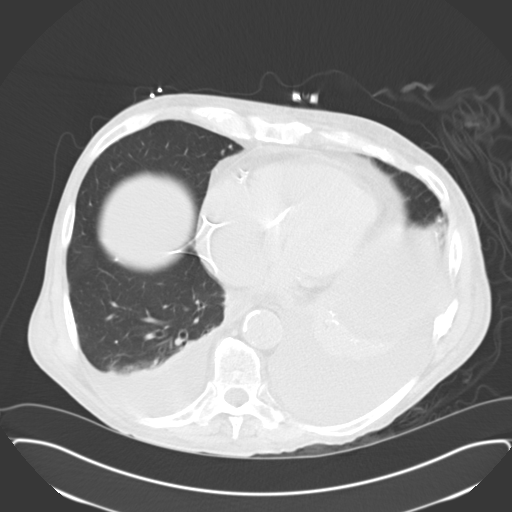
[im 28/62  lung]
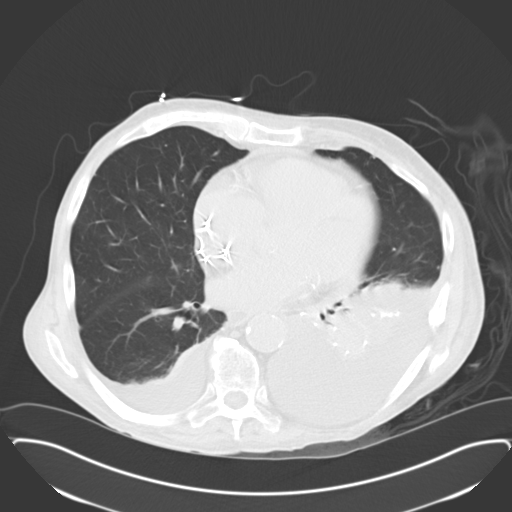
[im 34/62  lung]
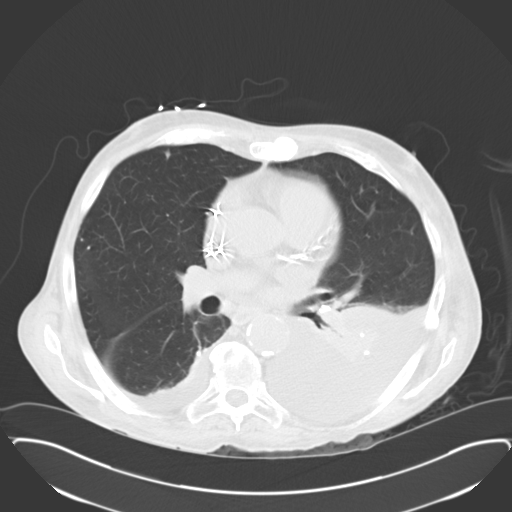
[im 39/62  lung]
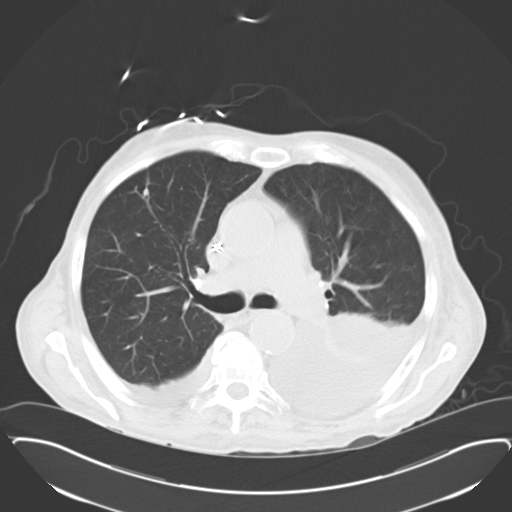
[im 43/62  mediastinal]
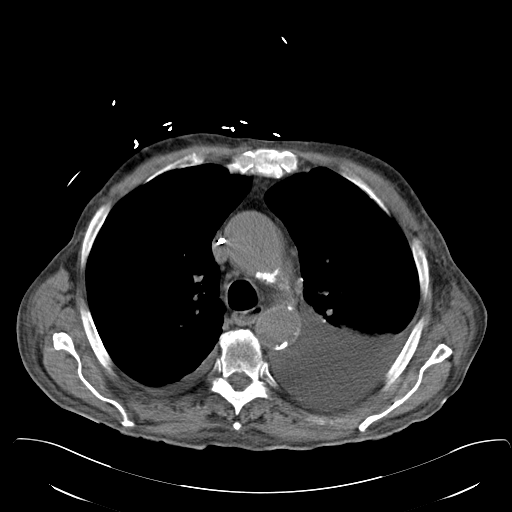
[im 43/62  lung]
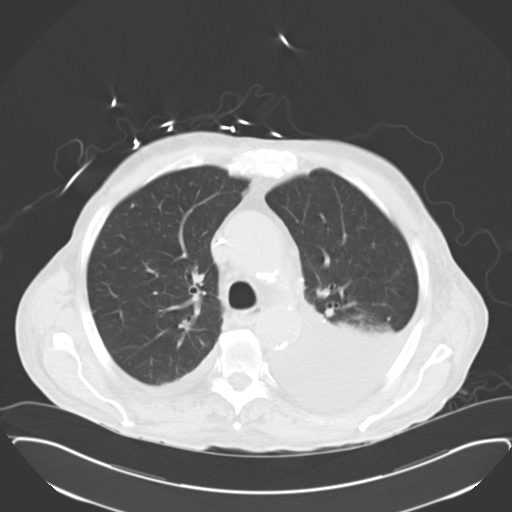
[im 48/62  lung]
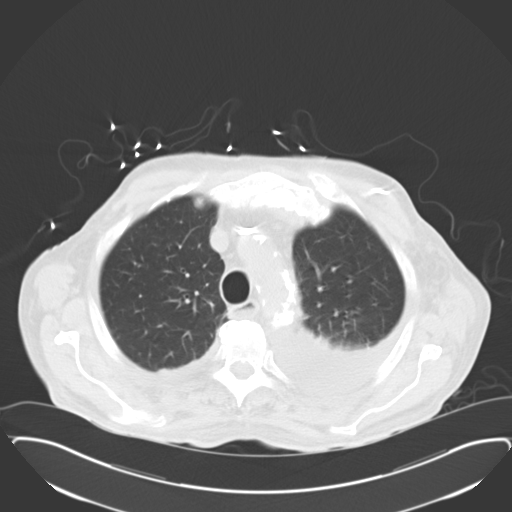
[im 52/62  lung]
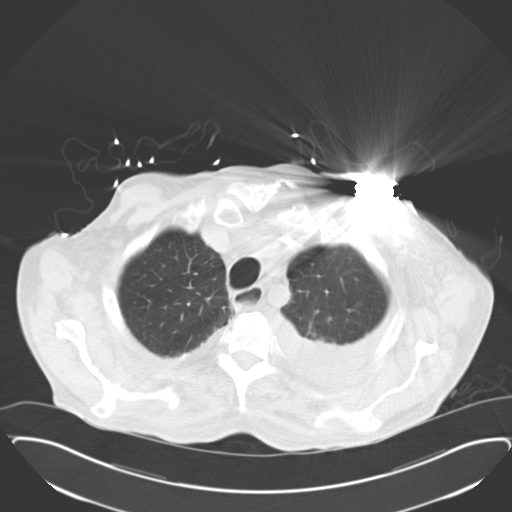
[im 57/62  lung]
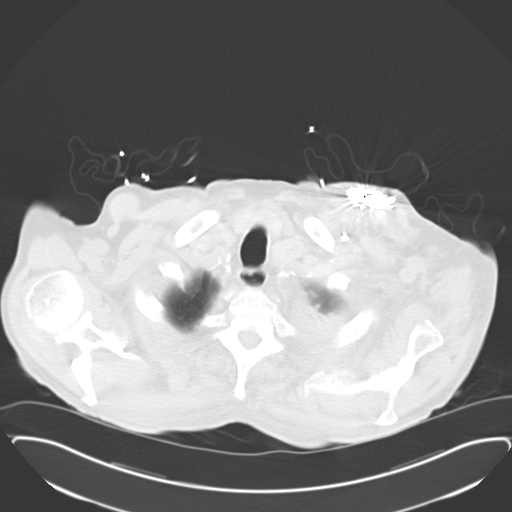

[Series 5: cor routine chest wo · coronal · 0.68mm/px · 3 of 141 slices shown]
[im 29/141  lung]
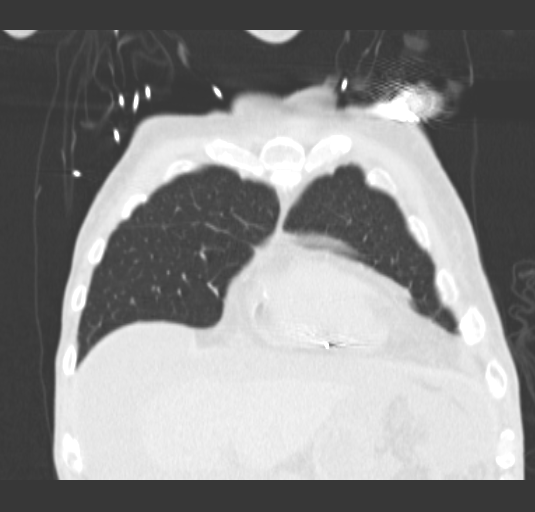
[im 57/141  lung]
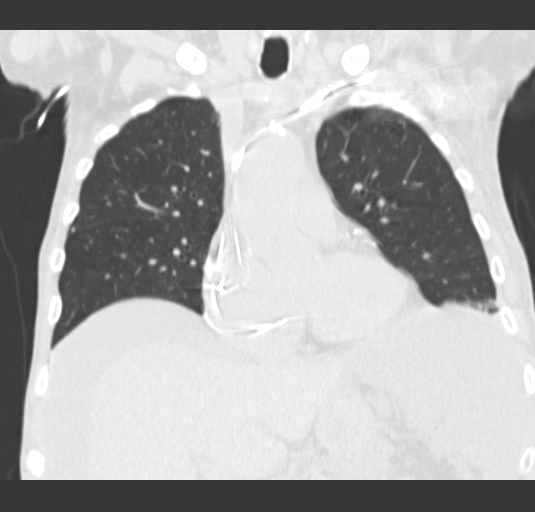
[im 85/141  lung]
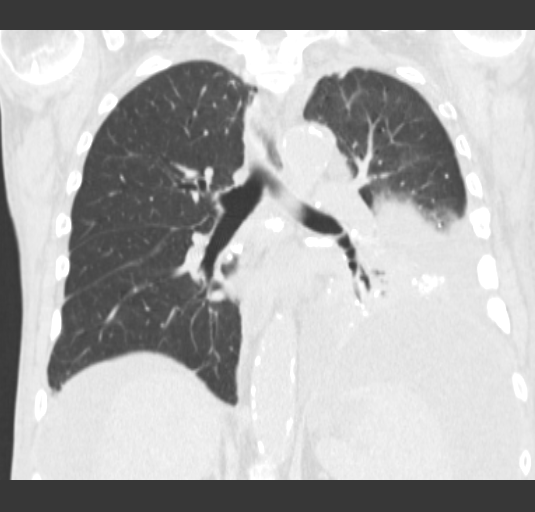

[15 of 36 positions shown; findings below may reference images not displayed]

FINDINGS: There is a large free-flowing left pleural effusion. There is a
moderate free-flowing right pleural effusion. There is airspace
consolidation throughout much of the the left lower lobe with volume
loss. There is milder consolidation in the right lower lobe.

There is underlying centrilobular emphysema with bronchiectatic
change in both upper and lower lobes.

There are pacemaker leads attached to the right atrium and right
ventricle. There is a rather minimal pericardial effusion.

There is no appreciable thoracic adenopathy. There are sub- carinal
calcified lymph nodes. There is also calcification of several lymph
nodes in the left and right hilar regions without lymph node
enlargement. There is atherosclerotic change in the aorta but no
aneurysm. No pulmonary embolus is seen on this noncontrast enhanced
study.

In the visualized upper abdomen, there is extensive ascites. The
liver as a somewhat nodular contour. There is atherosclerotic change
in the upper abdomen.

Thyroid appears normal. There is degenerative change in the thoracic
spine with diffuse idiopathic skeletal hyperostosis. There is an old
healed rib fractures on the left. There are no blastic or lytic bone
lesions.
IMPRESSION: Large left pleural effusion with consolidation of most of the left
lower lobe. Moderate right pleural effusion with consolidation in a
portion of the right lower lobe.

Evidence of prior granulomatous disease with multiple calcified
lymph nodes common nonenlarged.

Atherosclerotic change.

Pacemaker leads attached to the right heart. Minimal pericardial
effusion.

Centrilobular emphysematous change with bronchiectatic change
bilaterally.

The appearance of the liver is consistent with cirrhosis. There is
moderate generalized ascites throughout the visualized upper
abdomen.

## 2015-10-31 IMAGING — CR DG CHEST POST BIOPSY - THORACENTESIS
1 series · 1 of 1 positions shown · non-contrast
Comparison: 05/31/2014

CLINICAL DATA: Post left thoracentesis

EXAM:
CHEST XRAY 1 VIEW

[x chest ap]
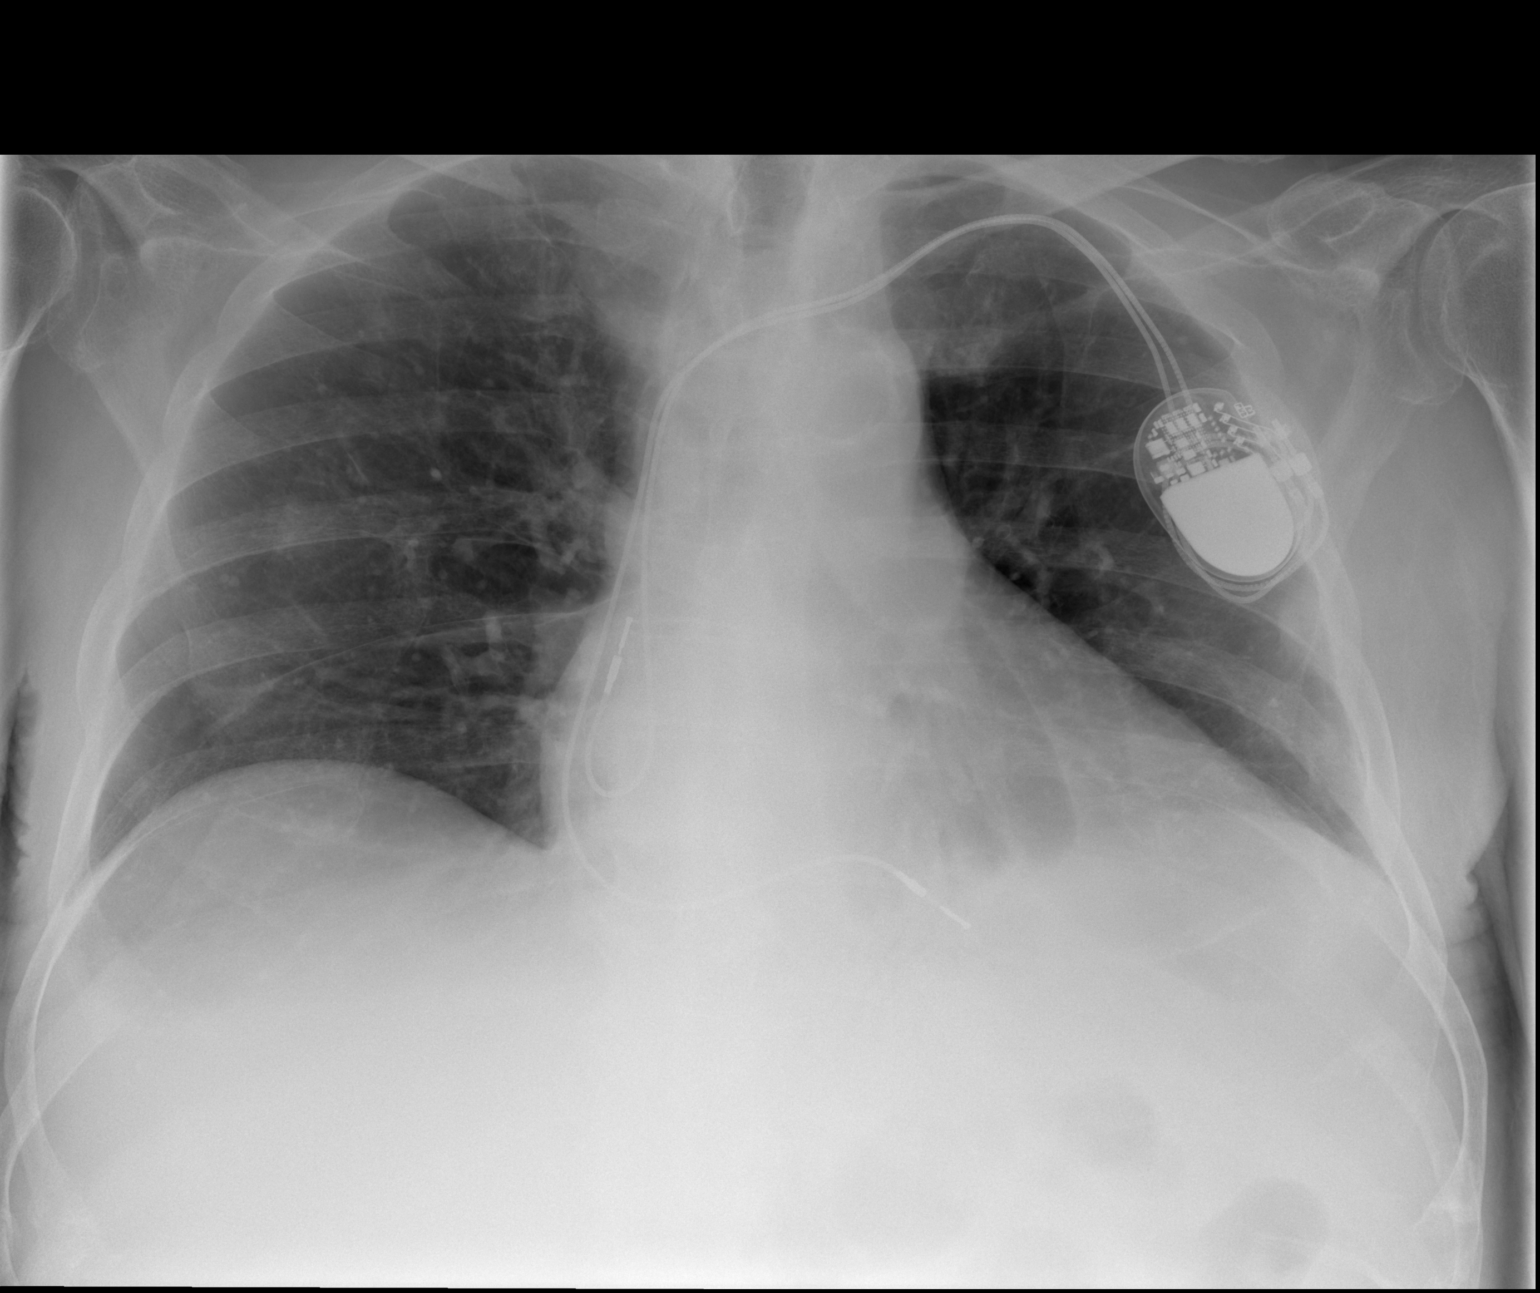

[1 of 1 positions shown; findings below may reference images not displayed]

FINDINGS: Cardiomediastinal silhouette is stable. Improvement in aeration left
base. Left pleural effusion has resolved. Dual lead cardiac
pacemaker in place. No pneumothorax. No infiltrate or pulmonary
edema.
IMPRESSION: Improvement in aeration left base. Left pleural effusion has
resolved. No pneumothorax. Dual lead cardiac pacemaker in place. No
segmental infiltrate

## 2015-11-04 IMAGING — CR DG KNEE COMPLETE 4+V*L*
1 series · 4 of 4 positions shown · non-contrast
Comparison: None.

CLINICAL DATA: Left knee laceration with swelling.

EXAM:
LEFT KNEE - COMPLETE 4+ VIEW

[Series 1: dxr knee lt comp with obliques · 0.14mm/px · 4 of 4 slices shown]
[im 1/4]
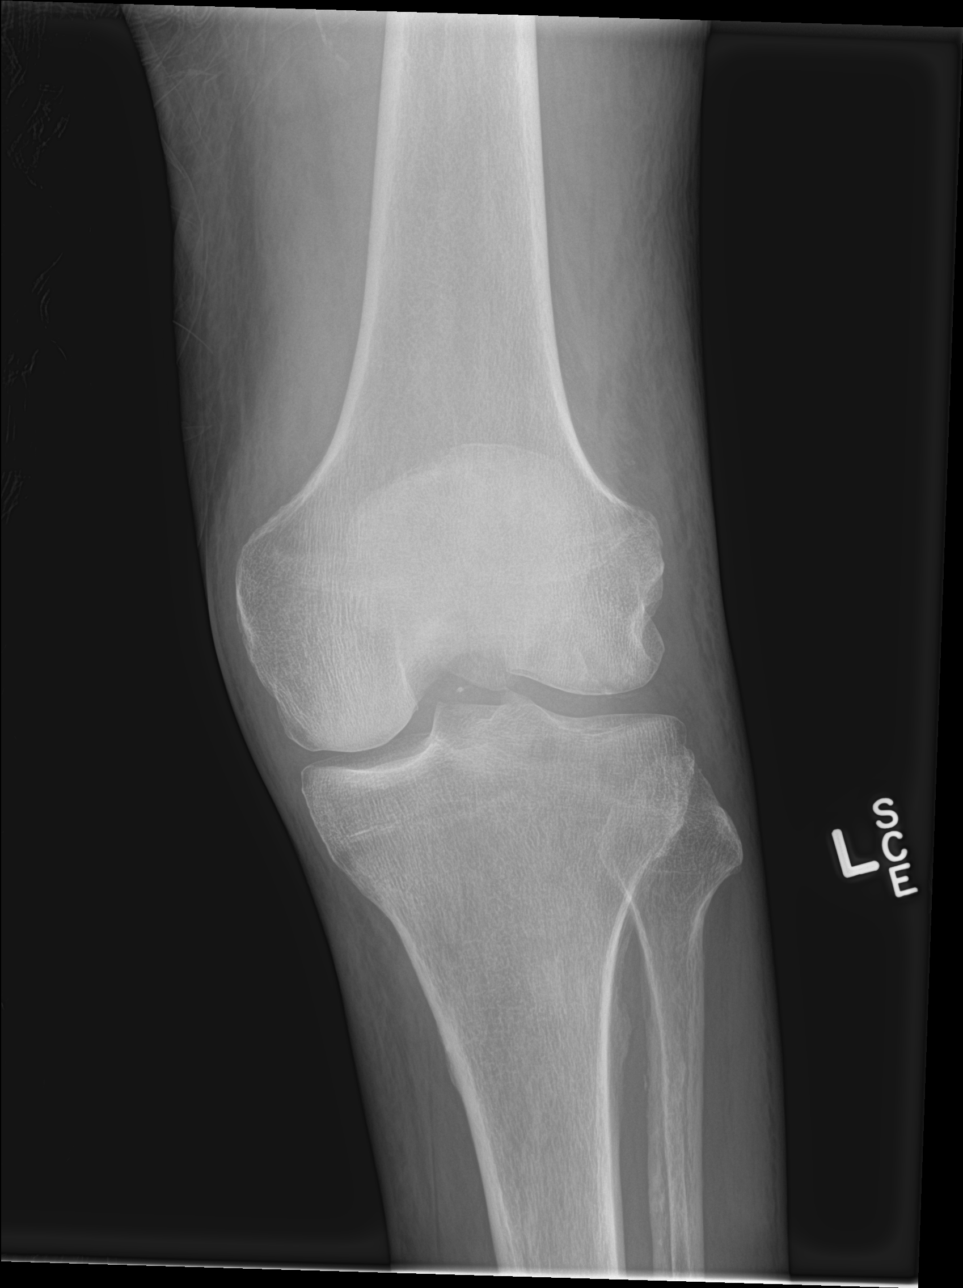
[im 2/4]
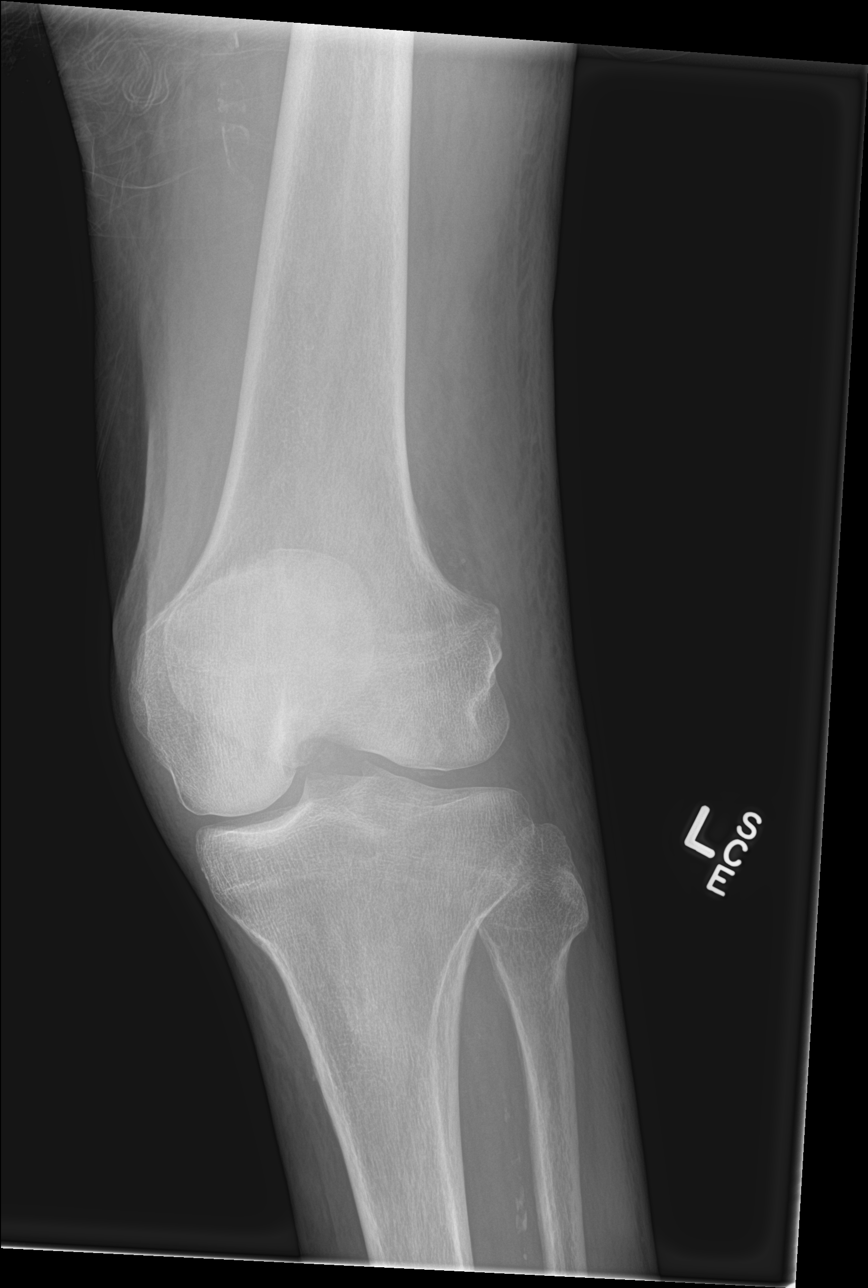
[im 3/4]
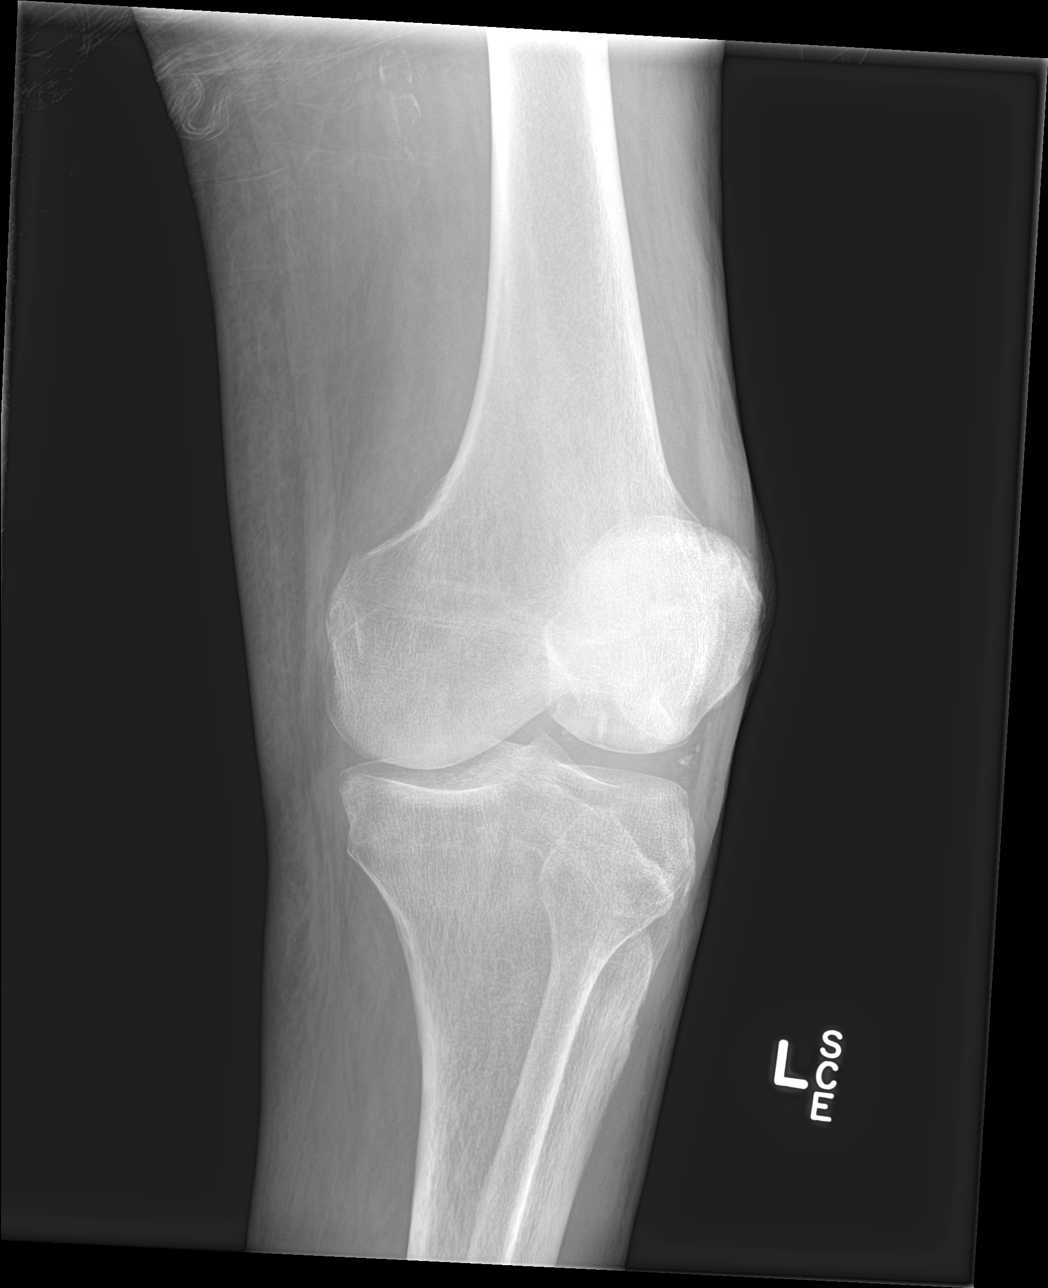
[im 4/4]
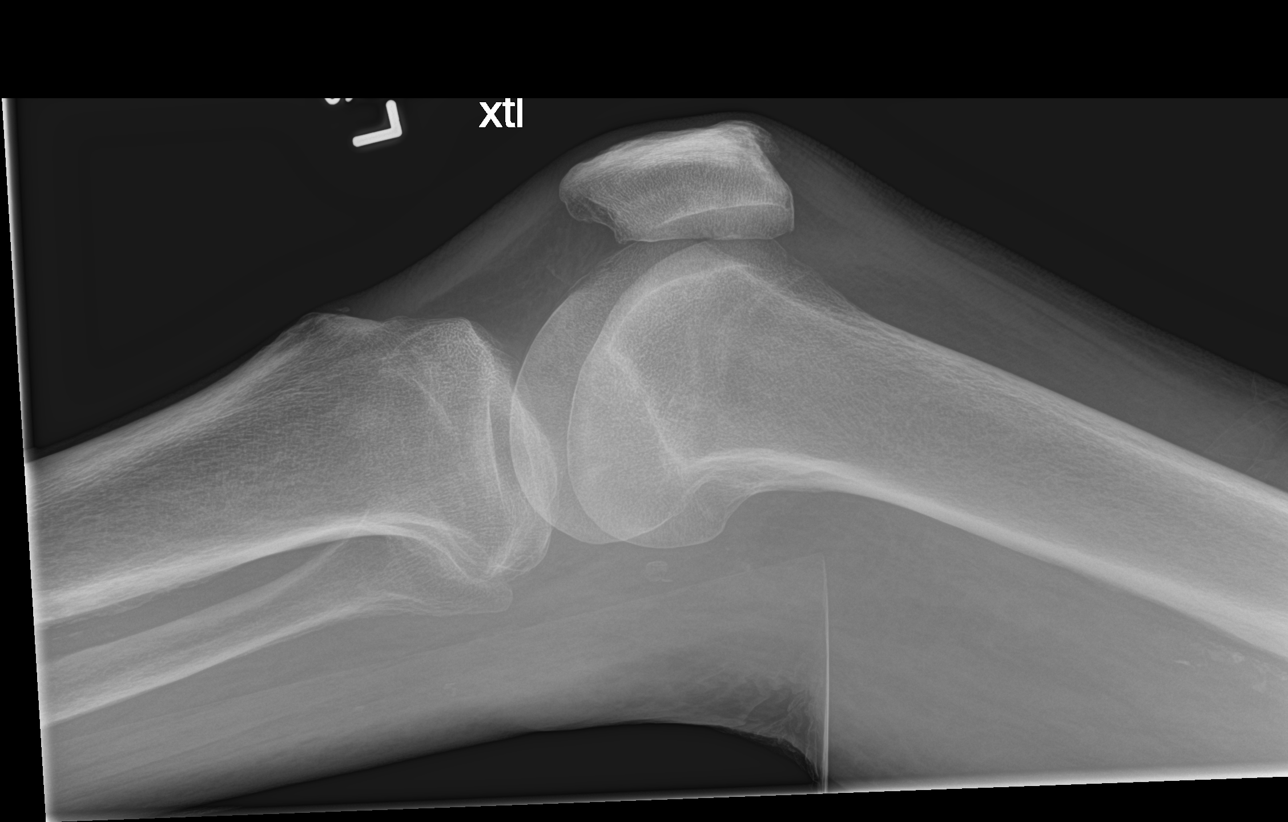

[4 of 4 positions shown; findings below may reference images not displayed]

FINDINGS: There is no evidence of fracture, dislocation, or joint effusion. No
significant joint space narrowing is noted. Mild spurring of the
superior aspect of the patella is noted. Soft tissues are
unremarkable.
IMPRESSION: No significant abnormality seen in the left knee.

## 2015-11-04 IMAGING — CR DG CHEST 1V PORT
1 series · 1 of 1 positions shown · non-contrast
Comparison: 06/01/2014.  05/31/2014.

CLINICAL DATA: Chest pain. Weakness. LEFT knee laceration. Initial
encounter. Per patient, no memory of falling but knee laceration.

EXAM:
PORTABLE CHEST - 1 VIEW

[dxr portable chest single view]
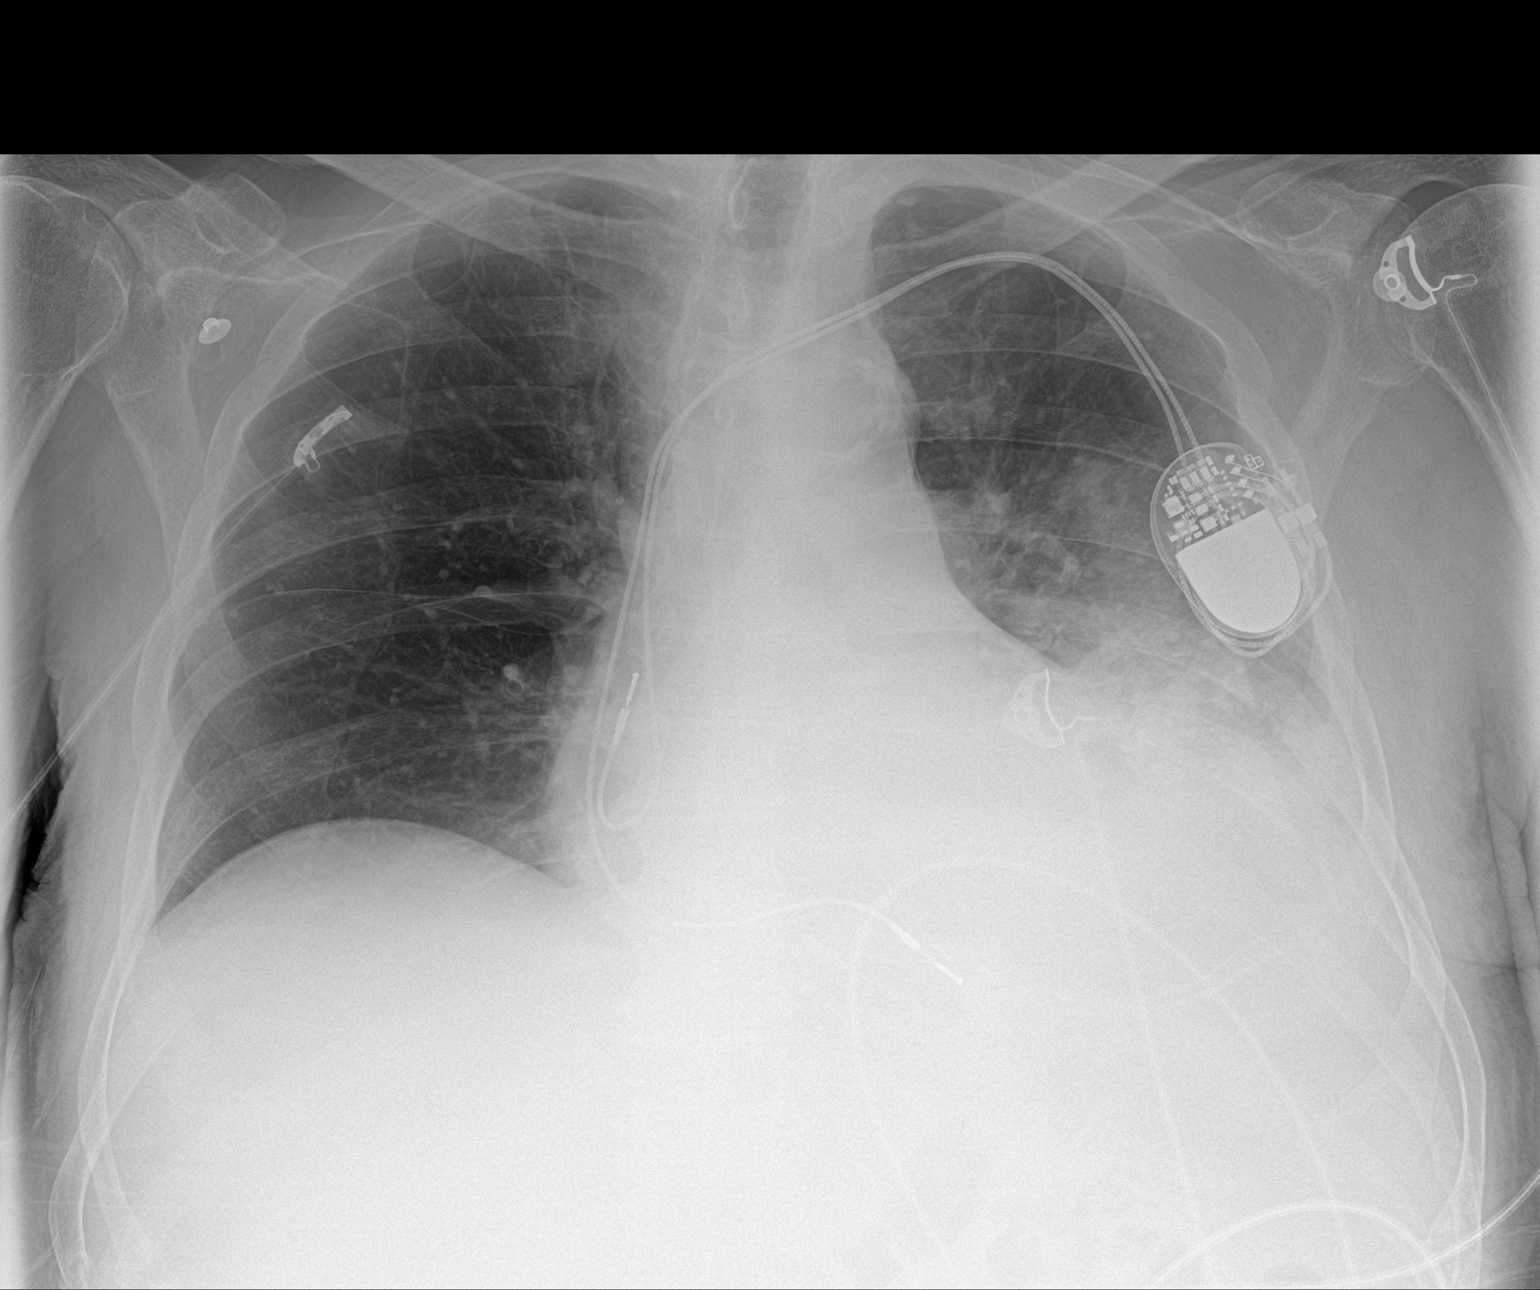

[1 of 1 positions shown; findings below may reference images not displayed]

FINDINGS: Old granulomatous disease is present with calcified granulomata
visible in the RIGHT lung. Monitoring leads project over the chest.
Dual lead LEFT subclavian pacemaker. There is airspace disease at
the LEFT lung base, which is new compared to the prior exam. This
was also present on chest radiograph 05/31/2014 and may represent
recurrent pulmonary edema based on the fluctuating course. Elevation
of the LEFT hemidiaphragm is present. There is a tiny LEFT apical
pneumothorax.
IMPRESSION: 1. Tiny each new LEFT apical pneumothorax. No visible displaced rib
fractures.
2. New airspace opacity at the LEFT lung base. Differential
considerations are pneumonia, asymmetric pulmonary edema and in the
setting of trauma, atelectasis or contusion.
3. Critical Value/emergent results were called by telephone at the
time of interpretation on 06/05/2014 at [DATE] to Dr. LOCKLEAR
COLLADO , who verbally acknowledged these results. By report,
patient had recent thoracentesis and this may represent the source
of the pneumothorax.
